# Patient Record
Sex: Female | Born: 1980 | Race: White | Hispanic: No | Marital: Single | Smoking: Current every day smoker
Health system: Southern US, Community
[De-identification: ages and names within clinical notes are randomized; demographics above are authoritative.]

## PROBLEM LIST (undated history)

## (undated) DIAGNOSIS — R011 Cardiac murmur, unspecified: Secondary | ICD-10-CM

---

## 2012-12-08 ENCOUNTER — Encounter (HOSPITAL_COMMUNITY): Payer: Self-pay

## 2012-12-08 ENCOUNTER — Emergency Department (HOSPITAL_COMMUNITY): Payer: Self-pay

## 2012-12-08 ENCOUNTER — Emergency Department (HOSPITAL_COMMUNITY)
Admission: EM | Admit: 2012-12-08 | Discharge: 2012-12-08 | Disposition: A | Discharge status: Home / Self Care | Payer: Self-pay | Attending: Emergency Medicine | Admitting: Emergency Medicine

## 2012-12-08 DIAGNOSIS — K219 Gastro-esophageal reflux disease without esophagitis: Secondary | ICD-10-CM | POA: Insufficient documentation

## 2012-12-08 DIAGNOSIS — R110 Nausea: Secondary | ICD-10-CM | POA: Insufficient documentation

## 2012-12-08 DIAGNOSIS — Z3202 Encounter for pregnancy test, result negative: Secondary | ICD-10-CM | POA: Insufficient documentation

## 2012-12-08 DIAGNOSIS — F17200 Nicotine dependence, unspecified, uncomplicated: Secondary | ICD-10-CM | POA: Insufficient documentation

## 2012-12-08 LAB — CBC WITH DIFFERENTIAL/PLATELET
Basophils Absolute: 0 10*3/uL (ref 0.0–0.1)
Eosinophils Relative: 1 % (ref 0–5)
Lymphocytes Relative: 29 % (ref 12–46)
Lymphs Abs: 2.3 10*3/uL (ref 0.7–4.0)
Neutro Abs: 4.9 10*3/uL (ref 1.7–7.7)
Neutrophils Relative %: 62 % (ref 43–77)
Platelets: 236 10*3/uL (ref 150–400)
RBC: 4.93 MIL/uL (ref 3.87–5.11)
RDW: 12 % (ref 11.5–15.5)
WBC: 7.9 10*3/uL (ref 4.0–10.5)

## 2012-12-08 LAB — PREGNANCY, URINE: Preg Test, Ur: NEGATIVE

## 2012-12-08 LAB — URINALYSIS, ROUTINE W REFLEX MICROSCOPIC
Leukocytes, UA: NEGATIVE
Nitrite: NEGATIVE
Specific Gravity, Urine: <1.005 — ABNORMAL LOW (ref 1.005–1.030)
Urobilinogen, UA: 0.2 mg/dL (ref 0.0–1.0)
pH: 7 (ref 5.0–8.0)

## 2012-12-08 LAB — COMPREHENSIVE METABOLIC PANEL
ALT: 12 U/L (ref 0–35)
AST: 21 U/L (ref 0–37)
Alkaline Phosphatase: 57 U/L (ref 39–117)
CO2: 30 mEq/L (ref 19–32)
Calcium: 9.6 mg/dL (ref 8.4–10.5)
GFR calc non Af Amer: >90 mL/min (ref >90)
Potassium: 3.8 mEq/L (ref 3.5–5.1)
Sodium: 140 mEq/L (ref 135–145)

## 2012-12-08 MED ORDER — RANITIDINE HCL 150 MG PO TABS: 150.0000 mg | ORAL_TABLET | Freq: Two times a day (BID) | ORAL | Status: DC

## 2012-12-08 MED ORDER — FAMOTIDINE 20 MG PO TABS
20.0000 mg | ORAL_TABLET | Freq: Once | ORAL | Status: AC
  Administered 2012-12-08: 20 mg via ORAL
  Filled 2012-12-08: qty 1

## 2012-12-08 MED ORDER — ONDANSETRON 8 MG PO TBDP
8.0000 mg | ORAL_TABLET | Freq: Once | ORAL | Status: AC
  Administered 2012-12-08: 8 mg via ORAL
  Filled 2012-12-08: qty 1

## 2012-12-08 MED ORDER — PROMETHAZINE HCL 12.5 MG PO TABS: 12.5000 mg | ORAL_TABLET | Freq: Four times a day (QID) | ORAL | Status: DC | PRN

## 2012-12-08 NOTE — ED Notes (Signed)
Was informed by Tech that patient smoking electronic cigarette. Patient advised of hospital policy on tobacco products.

## 2012-12-08 NOTE — ED Provider Notes (Signed)
History     CSN: [id number]  Arrival date & time 12/08/12  1001   First MD Initiated Contact with Patient 12/08/12 1044      Chief Complaint  Patient presents with  . Abdominal Pain    (Consider location/radiation/quality/duration/timing/severity/associated sxs/prior treatment) Patient is a 32 y.o. female presenting with abdominal pain. The history is provided by the patient.  Abdominal Pain Pain location:  Epigastric Pain quality: burning   Pain radiates to:  RUQ Pain severity:  Moderate Onset quality:  Gradual Duration:  2 days Timing:  Intermittent Chronicity:  New Relieved by:  Nothing Worsened by:  Eating Ineffective treatments:  Antacids and belching Associated symptoms: nausea   Associated symptoms: no chest pain, no chills, no fever, no shortness of breath, no sore throat and no vomiting     History reviewed. No pertinent past medical history.  History reviewed. No pertinent past surgical history.  No family history on file.  History  Substance Use Topics  . Smoking status: Current Every Day Smoker  . Smokeless tobacco: Not on file  . Alcohol Use: No    OB History   Grav Para Term Preterm Abortions TAB SAB Ect Mult Living                  Review of Systems  Constitutional: Negative for fever, chills, activity change and appetite change.  HENT: Negative for congestion, sore throat and neck pain.   Eyes: Negative for redness and itching.  Respiratory: Negative for shortness of breath.   Cardiovascular: Negative for chest pain.  Gastrointestinal: Positive for nausea and abdominal pain. Negative for vomiting.  Genitourinary: Negative for pelvic pain.  Musculoskeletal: Back pain: pain radiates to the back.  Skin: Negative for rash.  Neurological: Negative for dizziness, syncope and headaches.  Psychiatric/Behavioral: The patient is not nervous/anxious.     Allergies  Review of patient's allergies indicates no known allergies.  Home Medications    Current Outpatient Rx  Name  Route  Sig  Dispense  Refill  . tetrahydrozoline (VISINE) 0.05 % ophthalmic solution   Both Eyes   Place 1 drop into both eyes daily as needed (dry eyes).           BP 105/69  Pulse 90  Temp(Src) 98.9 F (37.2 C) (Oral)  Resp 20  Ht 5\' 2"  (1.575 m)  Wt 130 lb (58.968 kg)  BMI 23.77 kg/m2  SpO2 100%  LMP 11/24/2012  Physical Exam  Nursing note and vitals reviewed. Constitutional: She is oriented to person, place, and time. She appears well-developed and well-nourished. No distress.  Eyes: EOM are normal.  Neck: Neck supple.  Cardiovascular: Normal rate, regular rhythm and normal heart sounds.   Pulmonary/Chest: Effort normal and breath sounds normal.  Abdominal: Soft. Bowel sounds are normal. She exhibits no distension and no mass. There is tenderness in the epigastric area. There is no CVA tenderness.  Musculoskeletal: Normal range of motion.  Neurological: She is alert and oriented to person, place, and time. No cranial nerve deficit.  Skin: Skin is warm and dry.  Psychiatric: She has a normal mood and affect. Her behavior is normal. Judgment and thought content normal.   Results for orders placed during the hospital encounter of 12/08/12 (from the past 24 hour(s))  URINALYSIS, ROUTINE W REFLEX MICROSCOPIC     Status: Abnormal   Collection Time    12/08/12 10:23 AM      Result Value Range   Color, Urine YELLOW  YELLOW  APPearance CLEAR  CLEAR   Specific Gravity, Urine <1.005 (*) 1.005 - 1.030   pH 7.0  5.0 - 8.0   Glucose, UA NEGATIVE  NEGATIVE mg/dL   Hgb urine dipstick NEGATIVE  NEGATIVE   Bilirubin Urine NEGATIVE  NEGATIVE   Ketones, ur NEGATIVE  NEGATIVE mg/dL   Protein, ur NEGATIVE  NEGATIVE mg/dL   Urobilinogen, UA 0.2  0.0 - 1.0 mg/dL   Nitrite NEGATIVE  NEGATIVE   Leukocytes, UA NEGATIVE  NEGATIVE  PREGNANCY, URINE     Status: None   Collection Time    12/08/12 10:23 AM      Result Value Range   Preg Test, Ur NEGATIVE   NEGATIVE  CBC WITH DIFFERENTIAL     Status: Abnormal   Collection Time    12/08/12 10:59 AM      Result Value Range   WBC 7.9  4.0 - 10.5 K/uL   RBC 4.93  3.87 - 5.11 MIL/uL   Hemoglobin 15.4 (*) 12.0 - 15.0 g/dL   HCT 14.7  82.9 - 56.2 %   MCV 89.7  78.0 - 100.0 fL   MCH 31.2  26.0 - 34.0 pg   MCHC 34.8  30.0 - 36.0 g/dL   RDW 13.0  86.5 - 78.4 %   Platelets 236  150 - 400 K/uL   Neutrophils Relative 62  43 - 77 %   Neutro Abs 4.9  1.7 - 7.7 K/uL   Lymphocytes Relative 29  12 - 46 %   Lymphs Abs 2.3  0.7 - 4.0 K/uL   Monocytes Relative 8  3 - 12 %   Monocytes Absolute 0.6  0.1 - 1.0 K/uL   Eosinophils Relative 1  0 - 5 %   Eosinophils Absolute 0.1  0.0 - 0.7 K/uL   Basophils Relative 0  0 - 1 %   Basophils Absolute 0.0  0.0 - 0.1 K/uL  COMPREHENSIVE METABOLIC PANEL     Status: None   Collection Time    12/08/12 10:59 AM      Result Value Range   Sodium 140  135 - 145 mEq/L   Potassium 3.8  3.5 - 5.1 mEq/L   Chloride 101  96 - 112 mEq/L   CO2 30  19 - 32 mEq/L   Glucose, Bld 93  70 - 99 mg/dL   BUN 9  6 - 23 mg/dL   Creatinine, Ser 6.96  0.50 - 1.10 mg/dL   Calcium 9.6  8.4 - 29.5 mg/dL   Total Protein 7.3  6.0 - 8.3 g/dL   Albumin 4.4  3.5 - 5.2 g/dL   AST 21  0 - 37 U/L   ALT 12  0 - 35 U/L   Alkaline Phosphatase 57  39 - 117 U/L   Total Bilirubin 0.5  0.3 - 1.2 mg/dL   GFR calc non Af Amer >90  >90 mL/min   GFR calc Af Amer >90  >90 mL/min  LIPASE, BLOOD     Status: None   Collection Time    12/08/12 10:59 AM      Result Value Range   Lipase 26  11 - 59 U/L    US Abdomen Limited Ruq  12/08/2012  *RADIOLOGY REPORT*  Clinical Data:  Right upper quadrant and epigastric abdominal pain with associated nausea.  LIMITED ABDOMINAL ULTRASOUND - RIGHT UPPER QUADRANT  Comparison:  None.  Findings:  Gallbladder:  No shadowing gallstones or echogenic sludge.  No gallbladder wall thickening or  pericholecystic fluid.  Negative sonographic Murphy's sign according to the  ultrasound technologist.  Common bile duct:  Normal caliber of 4 mm.  Liver:  No focal mass lesion seen.  Within normal limits in parenchymal echogenicity.  IMPRESSION: Normal right upper quadrant ultrasound.                    Original Report Authenticated By: Irish Lack, M.D.     Assessment: 32 y.o. female with epigastric pain and nausea  Plan:  Treat symptoms   Follow up with GI    ED Course  Procedures (including critical care time)   MDM  I have reviewed this patient's vital signs, nurses notes, appropriate labs and imaging.  I have discussed findings and plan of care with the patient and need for follow up. Patient voices understanding.   Medication List    TAKE these medications       promethazine 12.5 MG tablet  Commonly known as:  PHENERGAN  Take 1 tablet (12.5 mg total) by mouth every 6 (six) hours as needed for nausea.     ranitidine 150 MG tablet  Commonly known as:  ZANTAC  Take 1 tablet (150 mg total) by mouth 2 (two) times daily.      ASK your doctor about these medications       VISINE 0.05 % ophthalmic solution  Generic drug:  tetrahydrozoline  Place 1 drop into both eyes daily as needed (dry eyes).               [place], [place] 12/08/12 [phone]

## 2012-12-08 NOTE — ED Notes (Signed)
Went into pt's room, pt was smoking a electronic cigarette, told pt that she can not smoke in the hospital.  RN is aware.

## 2012-12-08 NOTE — ED Notes (Signed)
Pt c/o upper abd pain x 2 days with nausea and "sour burps"  Denies vomiting or diarrhea.  LBM was yesterday

## 2012-12-13 NOTE — ED Provider Notes (Signed)
Medical screening examination/treatment/procedure(s) were performed by non-physician practitioner and as supervising physician I was immediately available for consultation/collaboration.   Shelda Jakes, MD 12/13/12 [phone]

## 2013-05-10 ENCOUNTER — Encounter (HOSPITAL_COMMUNITY): Payer: Self-pay | Admitting: *Deleted

## 2013-05-10 ENCOUNTER — Emergency Department (HOSPITAL_COMMUNITY)
Admission: EM | Admit: 2013-05-10 | Discharge: 2013-05-10 | Disposition: A | Discharge status: Home / Self Care | Payer: Self-pay | Attending: Emergency Medicine | Admitting: Emergency Medicine

## 2013-05-10 ENCOUNTER — Emergency Department (HOSPITAL_COMMUNITY): Payer: Self-pay

## 2013-05-10 DIAGNOSIS — R1013 Epigastric pain: Secondary | ICD-10-CM | POA: Insufficient documentation

## 2013-05-10 DIAGNOSIS — R110 Nausea: Secondary | ICD-10-CM | POA: Insufficient documentation

## 2013-05-10 DIAGNOSIS — R109 Unspecified abdominal pain: Secondary | ICD-10-CM

## 2013-05-10 DIAGNOSIS — Z3202 Encounter for pregnancy test, result negative: Secondary | ICD-10-CM | POA: Insufficient documentation

## 2013-05-10 DIAGNOSIS — Z79899 Other long term (current) drug therapy: Secondary | ICD-10-CM | POA: Insufficient documentation

## 2013-05-10 DIAGNOSIS — F17200 Nicotine dependence, unspecified, uncomplicated: Secondary | ICD-10-CM | POA: Insufficient documentation

## 2013-05-10 LAB — COMPREHENSIVE METABOLIC PANEL
BUN: 7 mg/dL (ref 6–23)
CO2: 26 mEq/L (ref 19–32)
Chloride: 104 mEq/L (ref 96–112)
Creatinine, Ser: 0.75 mg/dL (ref 0.50–1.10)
GFR calc non Af Amer: >90 mL/min (ref >90)
Glucose, Bld: 88 mg/dL (ref 70–99)
Total Bilirubin: 0.2 mg/dL — ABNORMAL LOW (ref 0.3–1.2)

## 2013-05-10 LAB — URINALYSIS, ROUTINE W REFLEX MICROSCOPIC
Glucose, UA: NEGATIVE mg/dL
Hgb urine dipstick: NEGATIVE
Ketones, ur: NEGATIVE mg/dL
Leukocytes, UA: NEGATIVE
pH: 7.5 (ref 5.0–8.0)

## 2013-05-10 LAB — CBC WITH DIFFERENTIAL/PLATELET
HCT: 42.7 % (ref 36.0–46.0)
Hemoglobin: 14.6 g/dL (ref 12.0–15.0)
Lymphocytes Relative: 34 % (ref 12–46)
Monocytes Absolute: 0.7 10*3/uL (ref 0.1–1.0)
Monocytes Relative: 8 % (ref 3–12)
Neutro Abs: 5 10*3/uL (ref 1.7–7.7)
WBC: 8.9 10*3/uL (ref 4.0–10.5)

## 2013-05-10 LAB — LIPASE, BLOOD: Lipase: 35 U/L (ref 11–59)

## 2013-05-10 MED ORDER — ONDANSETRON 8 MG PO TBDP
8.0000 mg | ORAL_TABLET | Freq: Once | ORAL | Status: AC
  Administered 2013-05-10: 8 mg via ORAL
  Filled 2013-05-10: qty 1

## 2013-05-10 MED ORDER — FAMOTIDINE 20 MG PO TABS
40.0000 mg | ORAL_TABLET | Freq: Once | ORAL | Status: AC
  Administered 2013-05-10: 40 mg via ORAL
  Filled 2013-05-10: qty 2

## 2013-05-10 MED ORDER — ONDANSETRON HCL 4 MG PO TABS: 4.0000 mg | ORAL_TABLET | Freq: Three times a day (TID) | ORAL | Status: DC | PRN

## 2013-05-10 NOTE — ED Notes (Signed)
Sharp pain w/pressure in epigastrum over last 2 weeks.  Gets a little worse with eating and lying down.  Nausea but denies vomiting or diarrhea.

## 2013-05-10 NOTE — ED Provider Notes (Signed)
CSN: [id number]     Arrival date & time 05/10/13  1535 History   First MD Initiated Contact with Patient 05/10/13 1543     Chief Complaint  Patient presents with  . Abdominal Pain    HPI Pt was seen at 1555.  Per pt, c/o gradual onset and persistence of constant upper abd "pain" for the past 1 year, worse over the past 2 weeks.   Has been associated with nausea. Describes the abd pain as "pressure." States she was previously taking zantac with relief. Pain worsens when she lays down. Denies any relation to food intake.  Denies vomiting/diarrhea, no fevers, no back pain, no rash, no CP/SOB, no black or blood in stools.  Pt has been evaluated previously in the ED for same, but did not f/u with PMD or GI MD.    History reviewed. No pertinent past medical history.  History reviewed. No pertinent past surgical history.  History  Substance Use Topics  . Smoking status: Current Every Day Smoker -- 1.00 packs/day    Types: Cigarettes  . Smokeless tobacco: Not on file  . Alcohol Use: No    Review of Systems ROS: Statement: All systems negative except as marked or noted in the HPI; Constitutional: Negative for fever and chills. ; ; Eyes: Negative for eye pain, redness and discharge. ; ; ENMT: Negative for ear pain, hoarseness, nasal congestion, sinus pressure and sore throat. ; ; Cardiovascular: Negative for chest pain, palpitations, diaphoresis, dyspnea and peripheral edema. ; ; Respiratory: Negative for cough, wheezing and stridor. ; ; Gastrointestinal: +nausea, abd pain. Negative for vomiting, diarrhea, blood in stool, hematemesis, jaundice and rectal bleeding. . ; ; Genitourinary: Negative for dysuria, flank pain and hematuria. ; ; Musculoskeletal: Negative for back pain and neck pain. Negative for swelling and trauma.; ; Skin: Negative for pruritus, rash, abrasions, blisters, bruising and skin lesion.; ; Neuro: Negative for headache, lightheadedness and neck stiffness. Negative for weakness,  altered level of consciousness , altered mental status, extremity weakness, paresthesias, involuntary movement, seizure and syncope.     Allergies  Review of patient's allergies indicates no known allergies.  Home Medications   Current Outpatient Rx  Name  Route  Sig  Dispense  Refill  . promethazine (PHENERGAN) 12.5 MG tablet   Oral   Take 1 tablet (12.5 mg total) by mouth every 6 (six) hours as needed for nausea.   15 tablet   0   . ranitidine (ZANTAC) 150 MG tablet   Oral   Take 1 tablet (150 mg total) by mouth 2 (two) times daily.   60 tablet   0   . tetrahydrozoline (VISINE) 0.05 % ophthalmic solution   Both Eyes   Place 1 drop into both eyes daily as needed (dry eyes).          BP 100/70  Pulse 86  Temp(Src) 98.3 F (36.8 C) (Oral)  Resp 16  Ht 5\' 2"  (1.575 m)  Wt 130 lb (58.968 kg)  BMI 23.77 kg/m2  SpO2 100% Physical Exam 1600: Physical examination:  Nursing notes reviewed; Vital signs and O2 SAT reviewed;  Constitutional: Well developed, Well nourished, Well hydrated, In no acute distress; Head:  Normocephalic, atraumatic; Eyes: EOMI, PERRL, No scleral icterus; ENMT: Mouth and pharynx normal, Mucous membranes moist; Neck: Supple, Full range of motion, No lymphadenopathy; Cardiovascular: Regular rate and rhythm, No murmur, rub, or gallop; Respiratory: Breath sounds clear & equal bilaterally, No rales, rhonchi, wheezes.  Speaking full sentences with ease, Normal  respiratory effort/excursion; Chest: Nontender, Movement normal; Abdomen: Soft, +mid-epigastric area tenderness to palp. No rebound or guarding. Nondistended, Normal bowel sounds; Genitourinary: No CVA tenderness; Extremities: Pulses normal, No tenderness, No edema, No calf edema or asymmetry.; Neuro: AA&Ox3, Major CN grossly intact.  Speech clear. Climbs on and off stretcher easily by herself. Gait steady. No gross focal motor or sensory deficits in extremities.; Skin: Color normal, Warm, Dry.   ED Course   Procedures    MDM  MDM Reviewed: previous chart, nursing note and vitals Reviewed previous: labs and ultrasound Interpretation: labs, x-ray and ultrasound     Results for orders placed during the hospital encounter of 05/10/13  PREGNANCY, URINE      Result Value Range   Preg Test, Ur NEGATIVE  NEGATIVE  URINALYSIS, ROUTINE W REFLEX MICROSCOPIC      Result Value Range   Color, Urine YELLOW  YELLOW   APPearance CLEAR  CLEAR   Specific Gravity, Urine 1.015  1.005 - 1.030   pH 7.5  5.0 - 8.0   Glucose, UA NEGATIVE  NEGATIVE mg/dL   Hgb urine dipstick NEGATIVE  NEGATIVE   Bilirubin Urine NEGATIVE  NEGATIVE   Ketones, ur NEGATIVE  NEGATIVE mg/dL   Protein, ur NEGATIVE  NEGATIVE mg/dL   Urobilinogen, UA 0.2  0.0 - 1.0 mg/dL   Nitrite NEGATIVE  NEGATIVE   Leukocytes, UA NEGATIVE  NEGATIVE  CBC WITH DIFFERENTIAL      Result Value Range   WBC 8.9  4.0 - 10.5 K/uL   RBC 4.66  3.87 - 5.11 MIL/uL   Hemoglobin 14.6  12.0 - 15.0 g/dL   HCT 16.1  09.6 - 04.5 %   MCV 91.6  78.0 - 100.0 fL   MCH 31.3  26.0 - 34.0 pg   MCHC 34.2  30.0 - 36.0 g/dL   RDW 40.9  81.1 - 91.4 %   Platelets 266  150 - 400 K/uL   Neutrophils Relative % 56  43 - 77 %   Neutro Abs 5.0  1.7 - 7.7 K/uL   Lymphocytes Relative 34  12 - 46 %   Lymphs Abs 3.1  0.7 - 4.0 K/uL   Monocytes Relative 8  3 - 12 %   Monocytes Absolute 0.7  0.1 - 1.0 K/uL   Eosinophils Relative 2  0 - 5 %   Eosinophils Absolute 0.1  0.0 - 0.7 K/uL   Basophils Relative 0  0 - 1 %   Basophils Absolute 0.0  0.0 - 0.1 K/uL  COMPREHENSIVE METABOLIC PANEL      Result Value Range   Sodium 141  135 - 145 mEq/L   Potassium 3.6  3.5 - 5.1 mEq/L   Chloride 104  96 - 112 mEq/L   CO2 26  19 - 32 mEq/L   Glucose, Bld 88  70 - 99 mg/dL   BUN 7  6 - 23 mg/dL   Creatinine, Ser 7.82  0.50 - 1.10 mg/dL   Calcium 9.3  8.4 - 95.6 mg/dL   Total Protein 7.3  6.0 - 8.3 g/dL   Albumin 4.1  3.5 - 5.2 g/dL   AST 20  0 - 37 U/L   ALT 13  0 - 35 U/L    Alkaline Phosphatase 63  39 - 117 U/L   Total Bilirubin 0.2 (*) 0.3 - 1.2 mg/dL   GFR calc non Af Amer >90  >90 mL/min   GFR calc Af Amer >90  >90 mL/min  LIPASE, BLOOD      Result Value Range   Lipase 35  11 - 59 U/L   Dg Abd Acute W/chest 05/10/2013   CLINICAL DATA:  Epigastric pain  EXAM: ACUTE ABDOMEN SERIES (ABDOMEN 2 VIEW & CHEST 1 VIEW)  COMPARISON:  None.  FINDINGS: Sigmoid scoliosis of the thoracolumbar spine. Heart size and mediastinal contours normal. Lungs clear. No free air. No abnormally dilated loops of bowel. Intrauterine device noted.  IMPRESSION: No acute findings   Electronically Signed   By: Esperanza Heir M.D.   On: 05/10/2013 16:43   US Abdomen Limited Ruq 05/10/2013   CLINICAL DATA:  Upper abdominal pain; patient had lunch 2 hr prior to exam  EXAM: US ABDOMEN LIMITED - RIGHT UPPER QUADRANT  COMPARISON:  12/08/2012  FINDINGS: Gallbladder  Partially distended, normal appearance. No gallbladder wall thickening, pericholecystic fluid, gallstones or sonographic Murphy's sign.  Common bile duct  Diameter: Normal caliber 4 mm diameter  Liver:  Normal appearance  No right upper quadrant free fluid identified.  IMPRESSION: Normal right upper quadrant ultrasound, unchanged.   Electronically Signed   By: Ulyses Southward M.D.   On: 05/10/2013 16:28    1845: Pt has tol PO well while in the ED without N/V.  No stooling while in the ED.  Abd benign, VSS. Feels better and wants to go home now. Dx and testing d/w pt.  Questions answered.  Verb understanding, agreeable to d/c home with outpt f/u.    Laray Anger, DO 05/13/13 1334

## 2014-05-13 ENCOUNTER — Emergency Department (HOSPITAL_COMMUNITY)
Admission: EM | Admit: 2014-05-13 | Discharge: 2014-05-13 | Disposition: A | Discharge status: Home / Self Care | Payer: Medicaid Other | Attending: Emergency Medicine | Admitting: Emergency Medicine

## 2014-05-13 ENCOUNTER — Encounter (HOSPITAL_COMMUNITY): Payer: Self-pay | Admitting: Emergency Medicine

## 2014-05-13 ENCOUNTER — Emergency Department (HOSPITAL_COMMUNITY): Payer: Medicaid Other

## 2014-05-13 DIAGNOSIS — Z720 Tobacco use: Secondary | ICD-10-CM | POA: Diagnosis not present

## 2014-05-13 DIAGNOSIS — R55 Syncope and collapse: Secondary | ICD-10-CM

## 2014-05-13 DIAGNOSIS — I951 Orthostatic hypotension: Secondary | ICD-10-CM | POA: Insufficient documentation

## 2014-05-13 DIAGNOSIS — Z3202 Encounter for pregnancy test, result negative: Secondary | ICD-10-CM | POA: Insufficient documentation

## 2014-05-13 DIAGNOSIS — N390 Urinary tract infection, site not specified: Secondary | ICD-10-CM | POA: Insufficient documentation

## 2014-05-13 LAB — URINALYSIS, ROUTINE W REFLEX MICROSCOPIC
Bilirubin Urine: NEGATIVE
Glucose, UA: NEGATIVE mg/dL
KETONES UR: NEGATIVE mg/dL
Nitrite: NEGATIVE
PROTEIN: NEGATIVE mg/dL
Specific Gravity, Urine: 1.01 (ref 1.005–1.030)
Urobilinogen, UA: 0.2 mg/dL (ref 0.0–1.0)
pH: 7.5 (ref 5.0–8.0)

## 2014-05-13 LAB — URINE MICROSCOPIC-ADD ON

## 2014-05-13 LAB — I-STAT CHEM 8, ED
BUN: 10 mg/dL (ref 6–23)
CHLORIDE: 105 meq/L (ref 96–112)
CREATININE: 0.9 mg/dL (ref 0.50–1.10)
Calcium, Ion: 1.14 mmol/L (ref 1.12–1.23)
GLUCOSE: 79 mg/dL (ref 70–99)
HCT: 45 % (ref 36.0–46.0)
HEMOGLOBIN: 15.3 g/dL — AB (ref 12.0–15.0)
Potassium: 3.8 mEq/L (ref 3.7–5.3)
Sodium: 141 mEq/L (ref 137–147)
TCO2: 28 mmol/L (ref 0–100)

## 2014-05-13 LAB — PREGNANCY, URINE: Preg Test, Ur: NEGATIVE

## 2014-05-13 MED ORDER — SODIUM CHLORIDE 0.9 % IV BOLUS (SEPSIS)
1000.0000 mL | Freq: Once | INTRAVENOUS | Status: AC
  Administered 2014-05-13: 1000 mL via INTRAVENOUS

## 2014-05-13 MED ORDER — SODIUM CHLORIDE 0.9 % IV SOLN: INTRAVENOUS | Status: DC

## 2014-05-13 MED ORDER — CEPHALEXIN 500 MG PO CAPS: 500.0000 mg | ORAL_CAPSULE | Freq: Four times a day (QID) | ORAL | Status: DC

## 2014-05-13 NOTE — ED Notes (Signed)
Pt's IV d/c'd from right AC, catheter intact and site WNL

## 2014-05-13 NOTE — Discharge Instructions (Signed)
°Emergency Department Resource Guide °1) Find a Doctor and Pay Out of Pocket °Although you won't have to find out who is covered by your insurance plan, it is a good idea to ask around and get recommendations. You will then need to call the office and see if the doctor you have chosen will accept you as a new patient and what types of options they offer for patients who are self-pay. Some doctors offer discounts or will set up payment plans for their patients who do not have insurance, but you will need to ask so you aren't surprised when you get to your appointment. ° °2) Contact Your Local Health Department °Not all health departments have doctors that can see patients for sick visits, but many do, so it is worth a call to see if yours does. If you don't know where your local health department is, you can check in your phone book. The CDC also has a tool to help you locate your state's health department, and many state websites also have listings of all of their local health departments. ° °3) Find a Walk-in Clinic °If your illness is not likely to be very severe or complicated, you may want to try a walk in clinic. These are popping up all over the country in pharmacies, drugstores, and shopping centers. They're usually staffed by nurse practitioners or physician assistants that have been trained to treat common illnesses and complaints. They're usually fairly quick and inexpensive. However, if you have serious medical issues or chronic medical problems, these are probably not your best option. ° °No Primary Care Doctor: °- Call [facility] at  832-8000 - they can help you locate a primary care doctor that  accepts your insurance, provides certain services, etc. °- Physician Referral Service- 1-800-533-3463 ° °Chronic Pain Problems: °Organization         Address  Phone   Notes  °[facility]  (336) 297-2271 Patients need to be referred by their primary care doctor.  ° °Medication  Assistance: °Organization         Address  Phone   Notes  °[place] [facility] [address] °[place], [place] [address] (336) 641-8030 --Must be a resident of [place] °-- Must have NO insurance coverage whatsoever (no Medicaid/ Medicare, etc.) °-- The pt. MUST have a primary care doctor that directs their care regularly and follows them in the community °  °[facility]  (866) 331-1348   °[employer]  (888) 892-1162   ° °Agencies that provide inexpensive medical care: °Organization         Address  Phone   Notes  °[facility]  (336) 832-8035   °[facility]    (336) 832-7272   °[facility] [address] °[place], [place] [address] (336) 832-4777   °[facility] [address], °[place] (336) 271-4999   °[facility]    (336) 373-0678   °[facility]    (336) 272-1050   °[facility] ° [address], [place] Phone:  (336) 832-4444, Fax:  (336) 832-4440 Hours of Operation:  9 am - 6 pm, M-F.  Also accepts Medicaid/Medicare and self-pay.  °[facility] ° [address], [place] Phone: (336) 832-3150, Fax: (336) [phone]. Hours of Operation:  8:30 am - 5:30 pm, M-F.  Also accepts Medicaid and self-pay.  °[facility] [place] [address], [place] Phone: (336) 878-6027   °[facility] [address], [place], [place] (336)723-1848, Ext. 123 Mondays & Thursdays: 7-9 AM.  First 15 patients are seen on a first come, first serve basis. °  ° °Medicaid-accepting [place] Providers: ° °Organization         Address  Phone   Notes  °[facility] [address], [place] (336) 641-2100 Also accepts self-pay patients.  °[facility] [address], [place] ° (336) 856-9996   °[facility] [address], [place]  (336) 288-8857   °[facility] [address] [place] [address], [place] (336) 299-7000   °Veita Bland [address], [place]  ° (336) 373-1557 Only accepts [place] Access Medicaid patients after they have their name applied to their card.  ° °Self-Pay (no insurance) in [place]: ° °Organization         Address  Phone   Notes  °Sickle Cell Patients, [facility] [address], [place] (336) 832-1970   °[facility] [address], [place] (336) 832-4400   °[facility] ° [address], [place] (336) 992-4800   °[facility]/Dr. Osei-Bonsu ° [address] [place] [address], [place] or [address], [place] (336) 841-8500 Phone number for both [place] and [place] locations is the same.  °[facility] [address], [place] (336) 299-0000   °[facility] [address] [place] [address], [place] or [address] (336) 852-7530 °(336) 878-2260   °[facility] [address], [place] (336) 350-1642, phone; (336) 294-5005, fax Sees patients 1st and 3rd Saturday of every month.  Must not qualify for public or private insurance (i.e. Medicaid, Medicare, [place] Health Choice, Veterans' Benefits) • Household income should be no more than 200% of the poverty level •The clinic cannot treat you if you are pregnant or think you are pregnant • Sexually transmitted diseases are not treated at the clinic.  ° ° °Dental Care: °Organization         Address  Phone  Notes  °[place] [facility] [address], [place] (336) 641-6152 Accepts children up to age 21 who are enrolled in Medicaid or [place] Health Choice; pregnant women with a Medicaid card; and children who have applied for Medicaid or [place] Health Choice, but were declined, whose parents can pay a reduced fee at time of service.  °[place]  [facility] [place]  [address], [place] (336) 641-7733 Accepts children up to age 21 who are enrolled in Medicaid or [place] Health Choice; pregnant women with a Medicaid card; and children who have applied for Medicaid or [place] Health Choice, but were declined, whose parents can pay a reduced fee at time of service.  °[facility] ° [address], [place] (336) 641-4533 Patients are seen by appointment only. Walk-ins are not accepted. [facility] will see patients 18 years of age and older. °Monday - Tuesday (8am-5pm) °Most Wednesdays (8:30-5pm) °$30 per visit, cash only  °[facility] ° [address], [place] (336) 641-4533 Patients are seen by appointment only. Walk-ins are not accepted. [facility] will see patients 18 years of age and older. °One   Wednesday Evening (Monthly: Volunteer Based).  $30 per visit, cash only  °[facility]  (919) 537-3737 for adults; Children under age 4, call Graduate Pediatric Dentistry at ([phone]) [phone]. Children aged 4-14, please call ([phone]) [phone] to request a pediatric application. ° Dental services are provided in all areas of dental care including fillings, crowns and bridges, complete and partial dentures, implants, gum treatment, root canals, and extractions. Preventive care is also provided. Treatment is provided to both adults and children. °Patients are selected via a lottery and there is often a waiting list. °  °[facility] [address], °[place] ° (336) 763-8833 [email or web address] °  °[facility] [address], [place], [place] (336)723-1848, Ext. 123 Second and Fourth Thursday of each month, opens at 6:30 AM; Clinic ends at 9 AM.  Patients are seen on a first-come first-served basis, and a limited number are seen during each clinic.  ° °[facility] ° [address], [place], [place] (336) 723-7904    Eligibility Requirements °You must have lived in [place], [place], or [place] counties for at least the last three months. °  You cannot be eligible for state or federal sponsored healthcare insurance, including Veterans Administration, Medicaid, or Medicare. °  You generally cannot be eligible for healthcare insurance through your employer.  °  How to apply: °Eligibility screenings are held every Tuesday and Wednesday afternoon from 1:00 pm until 4:00 pm. You do not need an appointment for the interview!  °[facility] [address], [place], [place] 336-631-2330   °[facility]  336-342-8273   °[facility]  336-703-3100   °[facility]  336-570-6415   ° °Behavioral Health Resources in the Community: °Intensive Outpatient Programs °Organization         Address  Phone  Notes  °[facility] [address], [place], [place] 336-878-6098   °[facility] [address], [place], [place] 336-832-9800   °[facility] [address], [place], [place] ° 336-882-2125   °[facility] [address],  °[place], [place] 1-800-853-5163 or 336-641-4981   °Substance Abuse Resources °Organization         Address  Phone  Notes  °[facility]  336-882-2125   °[facility]  336-784-9470   °[facility]  336-285-9073   °[facility]  336-845-3988   °Residential & Outpatient Substance Abuse Program  1-800-659-3381   °Psychological Services °Organization         Address  Phone  Notes  °[facility]  336- 832-9600   °[facility]  336- 378-7881   °[facility] [address], [place] 1-800-853-5163 or 336-641-4981   ° °Mobile Crisis Teams °Organization         Address  Phone  Notes  °[facility], Mobile Crisis Care Unit  1-877-626-1772   °Assertive °[facility] ° [address]  [place], [place] 336-834-9664   °Sharon DeEsch [address] °[place] 336-554-5454   ° °Self-Help/Support Groups °Organization         Address  Phone             Notes  °[facility] of  - variety of support groups  336- 373-1402 Call for more information  °Narcotics Anonymous (NA), [facility] [address], °[place]  2 meetings at this location  ° °  Residential Treatment Programs Organization         Address  Phone  Notes  [facility] [address],    [place]  [phone]   [facility]  [address], [place] [phone], [place], [place] [phone]) [phone]   [facility] [address], [place] [phone] Admissions: 8am-3pm M-F  [facility] [address]    [place], [place] [phone]   [facility] [address], [place], [place] [phone]) [phone]   [facility] [address]  [place], [place] [phone]   [facility] Intensive Outpatient [address], [place], [place] [phone]   [facility] [address]  [place], [place] [phone] or (939) [phone]   [facility] [address] [place], [place] [phone] Accepts Medicaid  [facility] [address]  [place] [phone] Substance Abuse/Addiction Treatment   [facility] Organization         Address  Phone  Notes  [facility]  819-071-2813(888) [phone]   Angie FavaJulie Brannon, PhD [address] [place], [place]   ([phone]) [phone](336) [phone] or (724) [phone](336) [phone]   [facility]   [address] [place], [place] [phone](336) [phone]   [facility] [address], [place], [place] [phone](336) [phone] Insurance/Medicaid/sponsorship through [facility] [address]                                    [place], [place] [phone](336) [phone] Therapy/tele-psych/case    [facility] [address]   [place], [place] [phone]) [phone]    Dr. Lolly MustacheArfeen  [phone](336) [phone]   [facility] [place]  [employer] [facility] 1) [address], [place] 2) [address], [place] 3)  [address], [place] ([phone](336) [phone](336) [phone]  [phone](336) [phone]   [facility] 320-823-1589(336) 313-716-0233 or [phone](336) 5180741759 (After Hours)       Take the prescription as directed. Increase your fluids (ie: Gatorade) for the next several days. Call your regular medical doctor on Monday to schedule a follow up appointment within the next 2 days.  Return to the Emergency Department immediately sooner if worsening.

## 2014-05-13 NOTE — ED Notes (Signed)
Pt had 2 syncopal episodes last night, pt reports hitting head when falling on floor.  Pt reports feeling dizzy today and feels disoriented.

## 2014-05-13 NOTE — ED Provider Notes (Signed)
CSN: [id number]     Arrival date & time 05/13/14  1437 History   First MD Initiated Contact with Patient 05/13/14 1519     Chief Complaint  Patient presents with  . syncopal episode      HPI Pt was seen at 1525. Per pt, c/o sudden onset and resolution of 2 episodes of brief syncope that occurred last night. Pt states she stood up from laying down and "then just passed out." Family states they "saw her just fall to the floor." Pt states this occurred twice. Pt states she feels lightheaded today. Denies prodromal symptoms before either episode. Denies CP/palpitations, no SOB/cough, no abd pain, no N/V/D, no neck or back pain, no visual changes, no focal motor weakness, no tingling/numbness in extremities, no ataxia, no slurred speech, no facial droop, no seizure activity, no incont of bowel/bladder.     History reviewed. No pertinent past medical history.  History reviewed. No pertinent past surgical history.  History  Substance Use Topics  . Smoking status: Current Every Day Smoker -- 1.00 packs/day    Types: Cigarettes  . Smokeless tobacco: Not on file  . Alcohol Use: No     Comment: occ    Review of Systems ROS: Statement: All systems negative except as marked or noted in the HPI; Constitutional: Negative for fever and chills. ; ; Eyes: Negative for eye pain, redness and discharge. ; ; ENMT: Negative for ear pain, hoarseness, nasal congestion, sinus pressure and sore throat. ; ; Cardiovascular: Negative for chest pain, palpitations, diaphoresis, dyspnea and peripheral edema. ; ; Respiratory: Negative for cough, wheezing and stridor. ; ; Gastrointestinal: Negative for nausea, vomiting, diarrhea, abdominal pain, blood in stool, hematemesis, jaundice and rectal bleeding. . ; ; Genitourinary: Negative for dysuria, flank pain and hematuria. ; ; Musculoskeletal: +head injury. Negative for back pain and neck pain. Negative for swelling and deformity..; ; Skin: Negative for pruritus, rash,  abrasions, blisters, bruising and skin lesion.; ; Neuro: Negative for headache and neck stiffness. Negative for weakness, extremity weakness, paresthesias, involuntary movement, seizure and +lightheadedness, +syncope.       Allergies  Review of patient's allergies indicates no known allergies.  Home Medications   Prior to Admission medications   Medication Sig Start Date End Date Taking? Authorizing Provider  famotidine (ACID REDUCER) 10 MG tablet Take 10 mg by mouth daily as needed for heartburn or indigestion.   Yes Historical Provider, MD  meclizine (ANTIVERT) 12.5 MG tablet Take 12.5 mg by mouth daily as needed for dizziness.   Yes Historical Provider, MD  tetrahydrozoline (VISINE) 0.05 % ophthalmic solution Place 1 drop into both eyes daily as needed (dry eyes).   Yes Historical Provider, MD   BP 110/65  Pulse 87  Temp(Src) 98.3 F (36.8 C) (Oral)  Resp 18  SpO2 100% Physical Exam 1530: Physical examination:  Nursing notes reviewed; Vital signs and O2 SAT reviewed;  Constitutional: Well developed, Well nourished, Well hydrated, In no acute distress; Head:  Normocephalic, atraumatic; Eyes: EOMI, PERRL, No scleral icterus; ENMT: TM's clear bilat. +edemetous nasal turbinates bilat with clear rhinorrhea. Mouth and pharynx normal, Mucous membranes moist; Neck: Supple, Full range of motion, No lymphadenopathy; Cardiovascular: Regular rate and rhythm, No murmur, rub, or gallop; Respiratory: Breath sounds clear & equal bilaterally, No rales, rhonchi, wheezes.  Speaking full sentences with ease, Normal respiratory effort/excursion; Chest: Nontender, Movement normal; Abdomen: Soft, Nontender, Nondistended, Normal bowel sounds; Genitourinary: No CVA tenderness; Spine:  No midline CS, TS, LS tenderness.;; Extremities:  Pulses normal, No tenderness, No edema, No calf edema or asymmetry.; Neuro: AA&Ox3, Major CN grossly intact. No facial droop. Speech clear. No gross focal motor or sensory deficits in  extremities. Climbs on and off stretcher easily by herself. Gait steady.; Skin: Color normal, Warm, Dry.   ED Course  Procedures     EKG Interpretation   Date/Time:  Saturday May 13 2014 15:30:58 EDT Ventricular Rate:  73 PR Interval:  126 QRS Duration: 80 QT Interval:  360 QTC Calculation: 397 R Axis:   81 Text Interpretation:  Sinus rhythm No old tracing to compare Confirmed by  [facility]  MD, Nicholos JohnsKATHLEEN [phone]([id number]) on 05/13/2014 4:15:43 PM      MDM  MDM Reviewed: previous chart, nursing note and vitals Interpretation: labs, ECG, CT scan and x-ray   Results for orders placed during the hospital encounter of 05/13/14  URINALYSIS, ROUTINE W REFLEX MICROSCOPIC      Result Value Ref Range   Color, Urine YELLOW  YELLOW   APPearance CLEAR  CLEAR   Specific Gravity, Urine 1.010  1.005 - 1.030   pH 7.5  5.0 - 8.0   Glucose, UA NEGATIVE  NEGATIVE mg/dL   Hgb urine dipstick MODERATE (*) NEGATIVE   Bilirubin Urine NEGATIVE  NEGATIVE   Ketones, ur NEGATIVE  NEGATIVE mg/dL   Protein, ur NEGATIVE  NEGATIVE mg/dL   Urobilinogen, UA 0.2  0.0 - 1.0 mg/dL   Nitrite NEGATIVE  NEGATIVE   Leukocytes, UA SMALL (*) NEGATIVE  PREGNANCY, URINE      Result Value Ref Range   Preg Test, Ur NEGATIVE  NEGATIVE  URINE MICROSCOPIC-ADD ON      Result Value Ref Range   WBC, UA 21-50  <3 WBC/hpf   RBC / HPF 3-6  <3 RBC/hpf   Bacteria, UA FEW (*) RARE  I-STAT CHEM 8, ED      Result Value Ref Range   Sodium 141  137 - 147 mEq/L   Potassium 3.8  3.7 - 5.3 mEq/L   Chloride 105  96 - 112 mEq/L   BUN 10  6 - 23 mg/dL   Creatinine, Ser 1.910.90  0.50 - 1.10 mg/dL   Glucose, Bld 79  70 - 99 mg/dL   Calcium, Ion 4.781.14  2.951.12 - 1.23 mmol/L   TCO2 28  0 - 100 mmol/L   Hemoglobin 15.3 (*) 12.0 - 15.0 g/dL   HCT 62.145.0  30.836.0 - 65.746.0 %   Dg Chest 2 View 05/13/2014   CLINICAL DATA:  Syncope last night.  EXAM: CHEST  2 VIEW  COMPARISON:  May 10, 2013  FINDINGS: The heart size and mediastinal contours are  within normal limits. There is no focal infiltrate, pulmonary edema, or pleural effusion. Vessel on end is identified in the lateral right lung base. The visualized skeletal structures are unremarkable.  IMPRESSION: No active cardiopulmonary disease.   Electronically Signed   By: Sherian ReinWei-Chen  Lin M.D.   On: 05/13/2014 16:43   Ct Head Wo Contrast 05/13/2014   CLINICAL DATA:  Two syncopal episode last night. Patient hit back of head on floor. Dizziness. Initial evaluation.  EXAM: CT HEAD WITHOUT CONTRAST  TECHNIQUE: Contiguous axial images were obtained from the base of the skull through the vertex without intravenous contrast.  COMPARISON:  No prior.  FINDINGS: No intra-axial or extra-axial pathologic shoulder blood collection identified. No mass. No mass. No hydrocephalus visualized orbits are unremarkable. Visualized paranasal sinuses and mastoids are clear. No acute bony abnormality.  IMPRESSION:  No acute intracranial abnormality.   Electronically Signed   By: Maisie Fus  Register   On: 05/13/2014 16:53    1715:  Pt initially orthostatic on VS. IVF bolus given with improvement. +UTI, will tx with keflex. Workup otherwise reassuring. Pt wants to go home now. Dx and testing d/w pt and family.  Questions answered.  Verb understanding, agreeable to d/c home with outpt f/u.    Samuel Jester, DO 05/15/14 1751

## 2014-12-23 ENCOUNTER — Emergency Department (HOSPITAL_COMMUNITY)
Admission: EM | Admit: 2014-12-23 | Discharge: 2014-12-23 | Disposition: A | Discharge status: Home / Self Care | Payer: Worker's Compensation | Attending: Emergency Medicine | Admitting: Emergency Medicine

## 2014-12-23 ENCOUNTER — Encounter (HOSPITAL_COMMUNITY): Payer: Self-pay

## 2014-12-23 DIAGNOSIS — Z720 Tobacco use: Secondary | ICD-10-CM | POA: Insufficient documentation

## 2014-12-23 DIAGNOSIS — Z23 Encounter for immunization: Secondary | ICD-10-CM | POA: Diagnosis not present

## 2014-12-23 DIAGNOSIS — X19XXXA Contact with other heat and hot substances, initial encounter: Secondary | ICD-10-CM | POA: Insufficient documentation

## 2014-12-23 DIAGNOSIS — Z79899 Other long term (current) drug therapy: Secondary | ICD-10-CM | POA: Diagnosis not present

## 2014-12-23 DIAGNOSIS — S0592XA Unspecified injury of left eye and orbit, initial encounter: Secondary | ICD-10-CM | POA: Diagnosis present

## 2014-12-23 DIAGNOSIS — Y9289 Other specified places as the place of occurrence of the external cause: Secondary | ICD-10-CM | POA: Insufficient documentation

## 2014-12-23 DIAGNOSIS — Y990 Civilian activity done for income or pay: Secondary | ICD-10-CM | POA: Insufficient documentation

## 2014-12-23 DIAGNOSIS — Y9389 Activity, other specified: Secondary | ICD-10-CM | POA: Insufficient documentation

## 2014-12-23 DIAGNOSIS — H5712 Ocular pain, left eye: Secondary | ICD-10-CM

## 2014-12-23 MED ORDER — ERYTHROMYCIN 5 MG/GM OP OINT
TOPICAL_OINTMENT | Freq: Once | OPHTHALMIC | Status: AC
  Administered 2014-12-23: 1 via OPHTHALMIC
  Filled 2014-12-23: qty 3.5

## 2014-12-23 MED ORDER — TETRACAINE HCL 0.5 % OP SOLN
1.0000 [drp] | Freq: Once | OPHTHALMIC | Status: AC
  Administered 2014-12-23: 1 [drp] via OPHTHALMIC
  Filled 2014-12-23: qty 2

## 2014-12-23 MED ORDER — TETANUS-DIPHTH-ACELL PERTUSSIS 5-2.5-18.5 LF-MCG/0.5 IM SUSP
0.5000 mL | Freq: Once | INTRAMUSCULAR | Status: AC
  Administered 2014-12-23: 0.5 mL via INTRAMUSCULAR
  Filled 2014-12-23: qty 0.5

## 2014-12-23 MED ORDER — HYDROCODONE-ACETAMINOPHEN 5-325 MG PO TABS: 1.0000 | ORAL_TABLET | ORAL | Status: DC | PRN

## 2014-12-23 MED ORDER — HYPROMELLOSE (GONIOSCOPIC) 2.5 % OP SOLN: 1.0000 [drp] | OPHTHALMIC | Status: DC | PRN

## 2014-12-23 MED ORDER — FLUORESCEIN SODIUM 1 MG OP STRP
1.0000 | ORAL_STRIP | Freq: Once | OPHTHALMIC | Status: AC
  Administered 2014-12-23: 1 via OPHTHALMIC
  Filled 2014-12-23: qty 1

## 2014-12-23 MED ORDER — POLYVINYL ALCOHOL 1.4 % OP SOLN
1.0000 [drp] | OPHTHALMIC | Status: DC | PRN
  Administered 2014-12-23: 1 [drp] via OPHTHALMIC
  Filled 2014-12-23: qty 15

## 2014-12-23 NOTE — ED Provider Notes (Signed)
CSN: [id number]     Arrival date & time 12/23/14  2130 History   First MD Initiated Contact with Patient 12/23/14 0957     Chief Complaint  Patient presents with  . Eye Injury     (Consider location/radiation/quality/duration/timing/severity/associated sxs/prior Treatment) Patient is a 34 y.o. female presenting with eye injury. The history is provided by the patient.  Eye Injury This is a new problem. The current episode started today. The problem has been unchanged. Associated symptoms include a visual change. Treatments tried: cold water rinses. The treatment provided mild relief.   Hannah Molina is a 34 y.o. female who presents to the ED with left eye pain s/p injury at work. Patient states that at approximately 9 am grease splashed in her eye at work. She had immediate pain and decreased vision in the left eye. She went immediately and started flushing with cool water. She continues to have pain and decreased vision.   History reviewed. No pertinent past medical history. History reviewed. No pertinent past surgical history. No family history on file. History  Substance Use Topics  . Smoking status: Current Every Day Smoker -- 1.00 packs/day    Types: Cigarettes  . Smokeless tobacco: Not on file  . Alcohol Use: No     Comment: occ   OB History    No data available     Review of Systems Negative except as stated in HPI   Allergies  Review of patient's allergies indicates no known allergies.  Home Medications   Prior to Admission medications   Medication Sig Start Date End Date Taking? Authorizing Provider  BIOTIN PO Take 1 tablet by mouth daily.   Yes Historical Provider, MD  famotidine (ACID REDUCER) 10 MG tablet Take 10 mg by mouth daily as needed for heartburn or indigestion.   Yes Historical Provider, MD  meclizine (ANTIVERT) 12.5 MG tablet Take 12.5 mg by mouth daily as needed for dizziness.   Yes Historical Provider, MD  tetrahydrozoline (VISINE) 0.05 % ophthalmic  solution Place 1 drop into both eyes daily as needed (dry eyes).   Yes Historical Provider, MD  vitamin B-12 (CYANOCOBALAMIN) 1000 MCG tablet Take 1,000 mcg by mouth daily.   Yes Historical Provider, MD  cephALEXin (KEFLEX) 500 MG capsule Take 1 capsule (500 mg total) by mouth 4 (four) times daily. Patient not taking: Reported on 12/23/2014 05/13/14   Samuel Jester, DO  HYDROcodone-acetaminophen (NORCO/VICODIN) 5-325 MG per tablet Take 1 tablet by mouth every 4 (four) hours as needed. 12/23/14   Shinita Mac Orlene Och, NP   BP 107/85 mmHg  Pulse 84  Temp(Src) 98.2 F (36.8 C) (Oral)  Resp 16  Ht  (1.575 m)  Wt 130 lb (58.968 kg)  BMI 23.77 kg/m2  SpO2 99% Physical Exam  Constitutional: She is oriented to person, place, and time. She appears well-developed and well-nourished. No distress.  HENT:  Head: Normocephalic.  Eyes: EOM and lids are normal. Pupils are equal, round, and reactive to light. Left conjunctiva is injected.  Slit lamp exam:      The left eye shows no corneal abrasion, no corneal ulcer, no foreign body, no hyphema and no fluorescein uptake.  Conjunctiva irritation left.   Neck: Neck supple.  Cardiovascular: Normal rate.   Pulmonary/Chest: Effort normal.  Musculoskeletal: Normal range of motion.  Neurological: She is alert and oriented to person, place, and time. No cranial nerve deficit.  Skin: Skin is warm and dry.  Psychiatric: She has a normal mood and  affect. Her behavior is normal.  Nursing note and vitals reviewed.   ED Course  Procedures  Visual acuity, tetracaine Opth drops to left eye, stained, irrigated,  Dr. Estell HarpinZammit in to examine the patient.   MDM  34 y.o. female with splash of grease to corner of the left eye while at work. Stable for d/c to follow up with opthalmology. Tetanus updated, erythromycin opth. Ointment applied here and patient to continue for the next 5 days. She will use artificial tears as needed.  Pain management.  Final diagnoses:  Eye  pain, left       [facility] Jazleen Robeck, NP 12/24/14 1754  Bethann BerkshireJoseph Zammit, MD 12/24/14 2330

## 2014-12-23 NOTE — ED Notes (Signed)
Per patient vision is starting "to clear a little." Patient states "It's still a little blurry but I can tell it's coming back." EDP in room assessing patient.

## 2014-12-23 NOTE — ED Notes (Signed)
Pt states grease splattered in her left eye from a deep fryer. Work related injury. States she was told to go to [facility] but when she got there they were closed.

## 2014-12-23 NOTE — Discharge Instructions (Signed)
Your exam today shows that you have irritation to the left eye after the grease exposure. We do not see a blister or corneal abrasion at this time. You should follow up with Dr. Lita MainsHaines (eye doctor) for further evaluation. Use the antibiotic eye ointment every 8 hours for the next 5 days. Use artificial tears to keep the eye well lubricated. Apply cool compresses to the area and take Advil in addition to the narcotic. Do not drive while taking the narcotic. Follow up with Dr. Lita MainsHaines.

## 2014-12-23 NOTE — ED Notes (Signed)
[facility] on [address], [place], [place] [address] called at [phone] to inquire about drug screening for workers comp. Per manager, [employer] Covington, drug screen is not required.

## 2016-03-28 IMAGING — CT CT HEAD W/O CM
1 series · 16 of 30 positions shown, 20 images · non-contrast
Comparison: No prior.

CLINICAL DATA: Two syncopal episode last night. Patient hit back of
head on floor. Dizziness. Initial evaluation.

EXAM:
CT HEAD WITHOUT CONTRAST
TECHNIQUE: Contiguous axial images were obtained from the base of the skull
through the vertex without intravenous contrast.

[Series 2: headtrauma 4.8 h37s · axial · 0.40mm/px · z∈[+1015,+1145]mm · 16 of 30 slices shown, 20 images]
[im 2/30  brain]
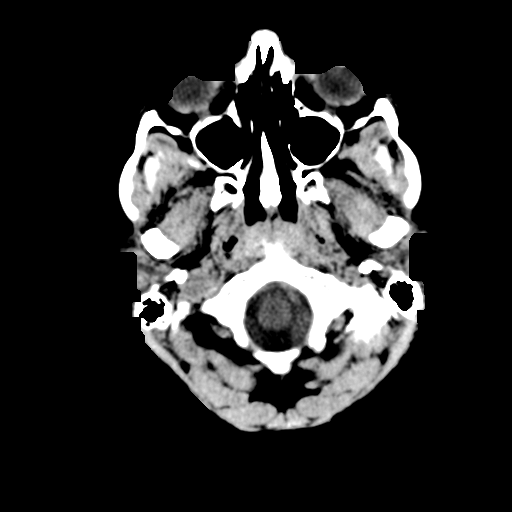
[im 2/30  bone]
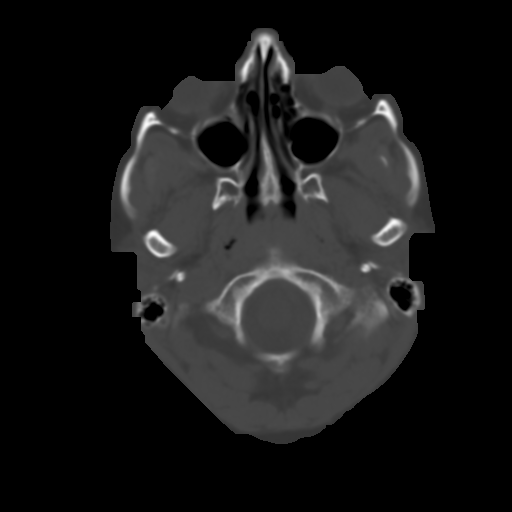
[im 4/30  brain]
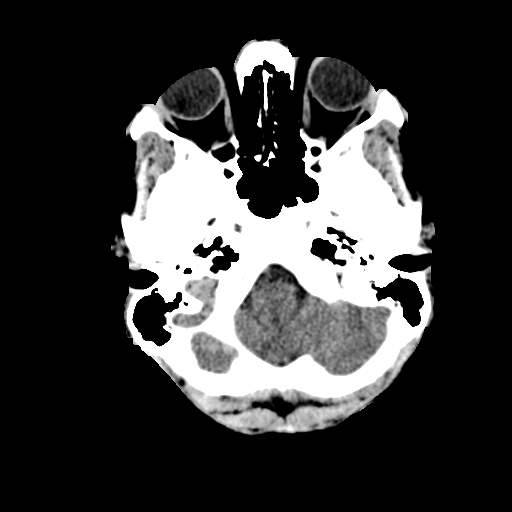
[im 6/30  brain]
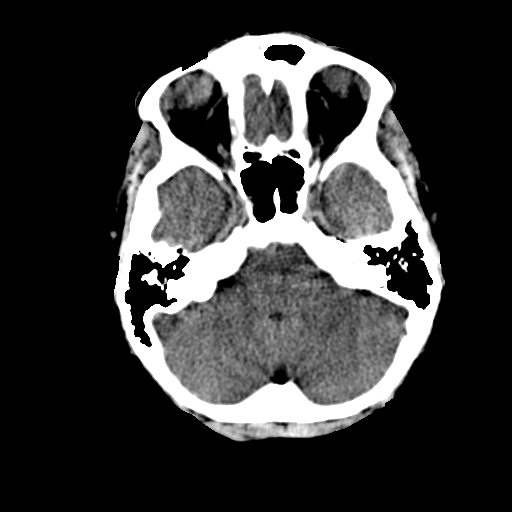
[im 8/30  brain]
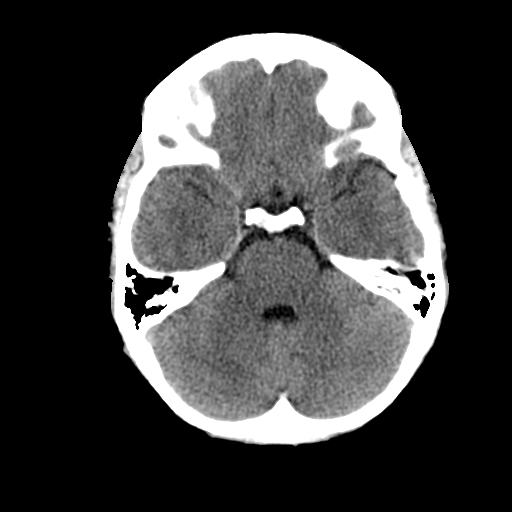
[im 9/30  brain]
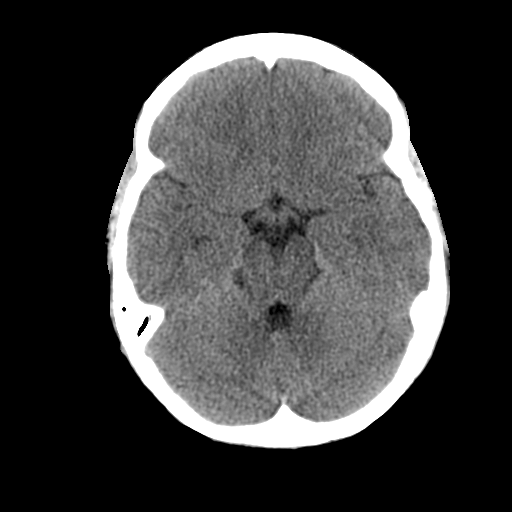
[im 9/30  bone]
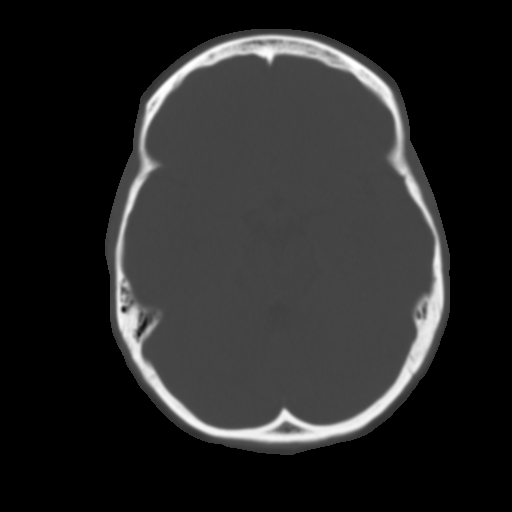
[im 11/30  brain]
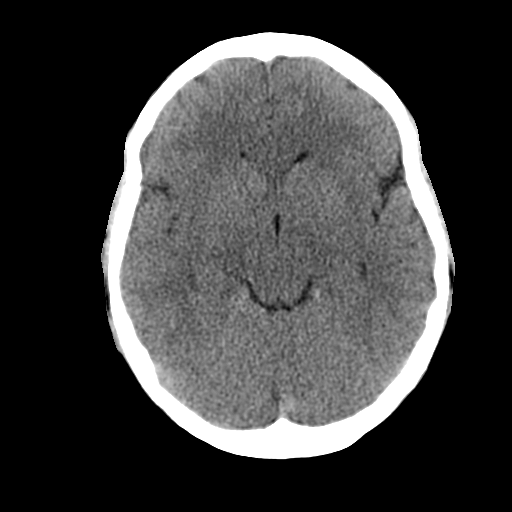
[im 13/30  brain]
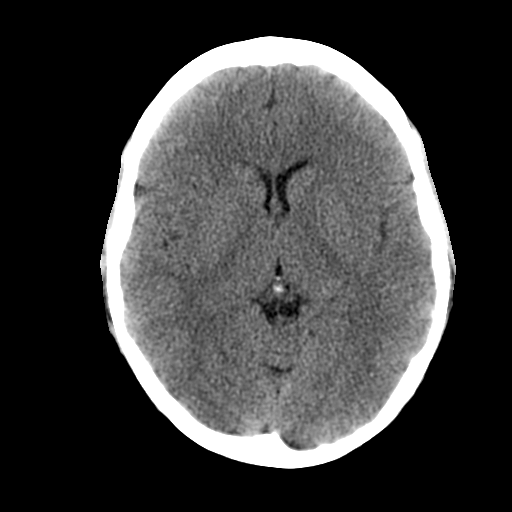
[im 15/30  brain]
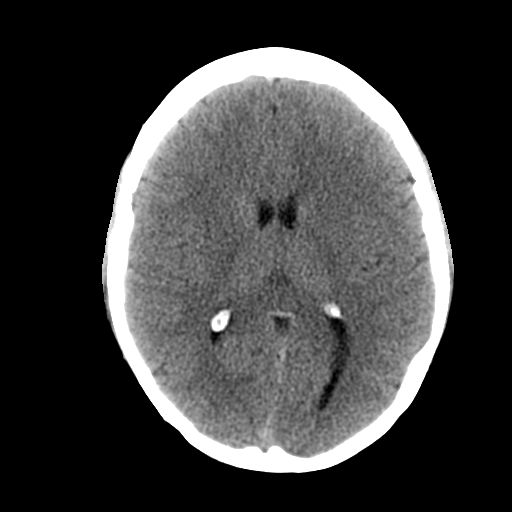
[im 16/30  brain]
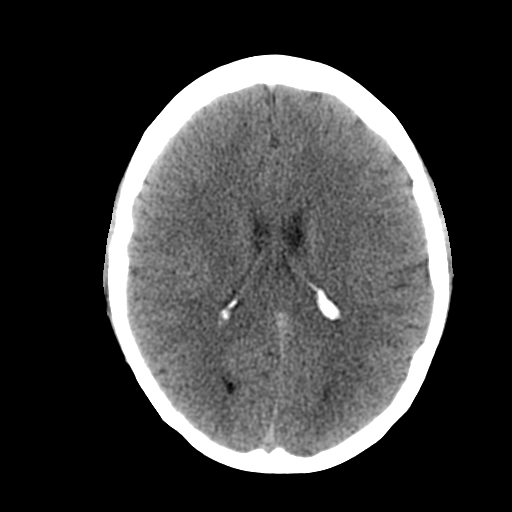
[im 16/30  bone]
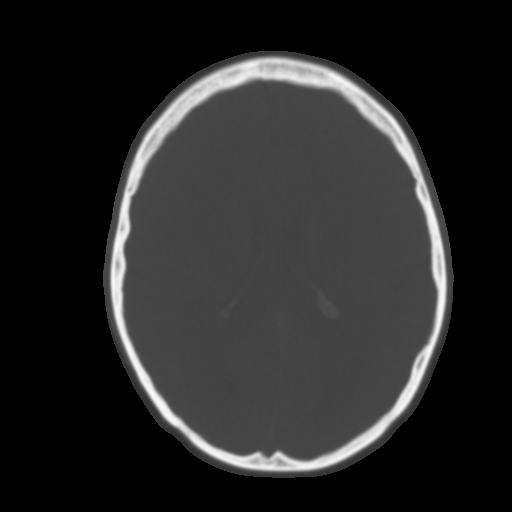
[im 18/30  brain]
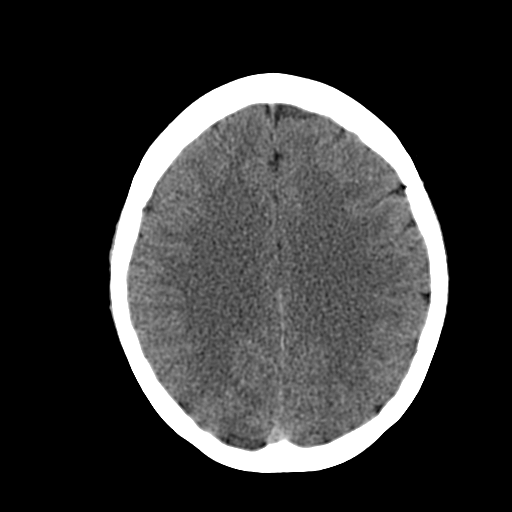
[im 20/30  brain]
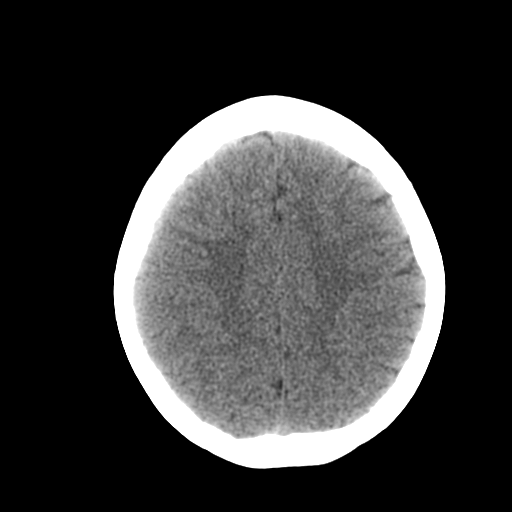
[im 22/30  brain]
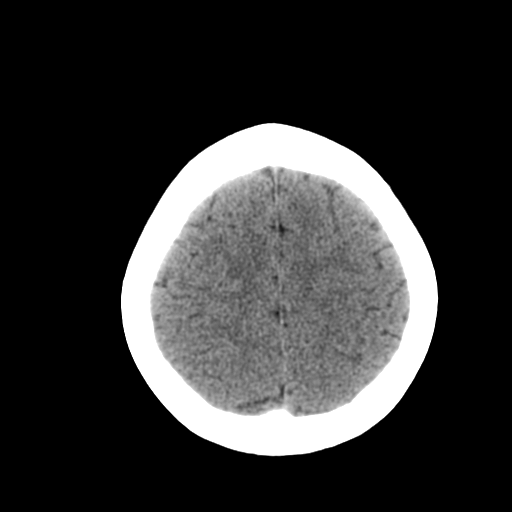
[im 23/30  brain]
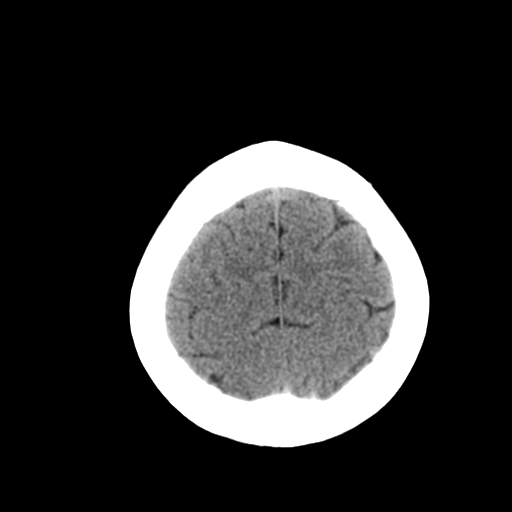
[im 23/30  bone]
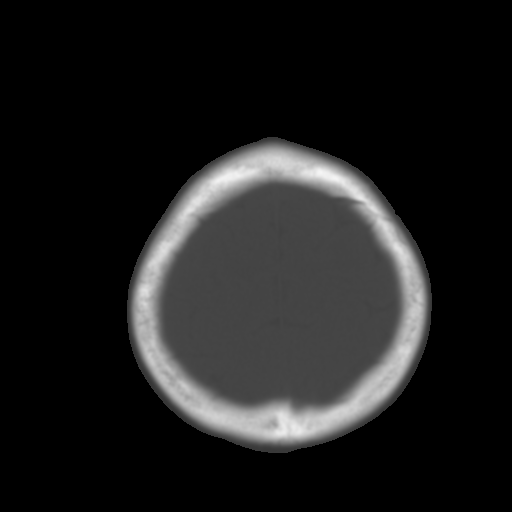
[im 25/30  brain]
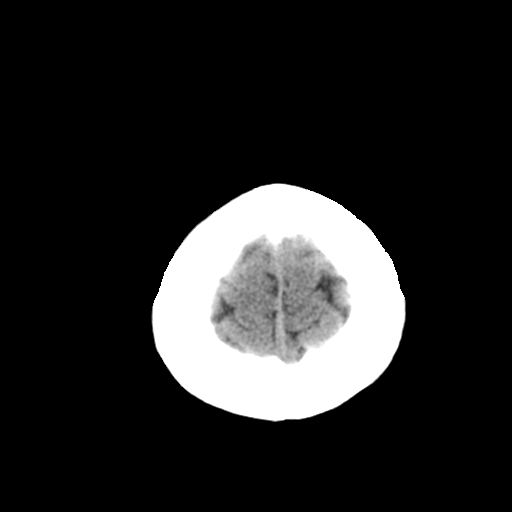
[im 27/30  brain]
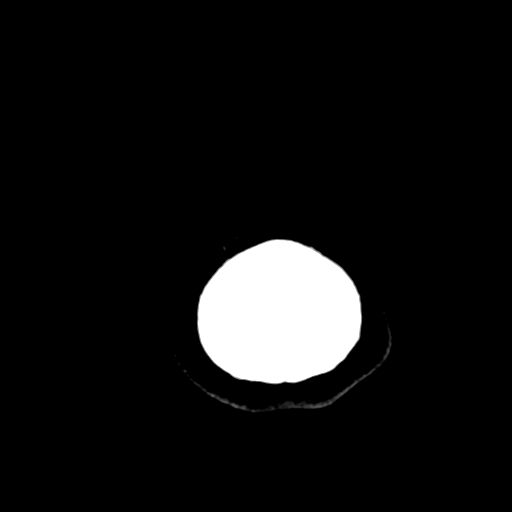
[im 29/30  brain]
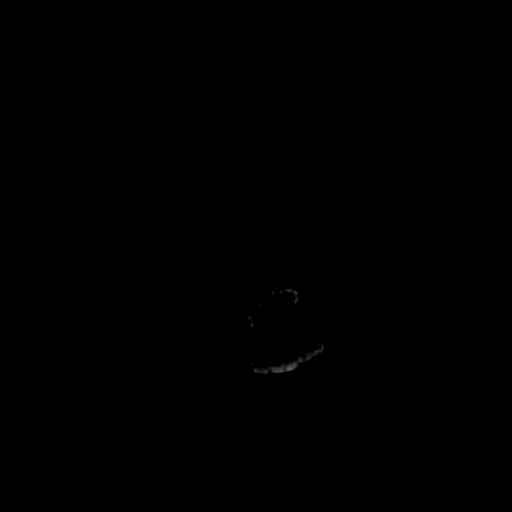

[16 of 30 positions shown; findings below may reference images not displayed]

FINDINGS: No intra-axial or extra-axial pathologic shoulder blood collection
identified. No mass. No mass. No hydrocephalus visualized orbits are
unremarkable. Visualized paranasal sinuses and mastoids are clear.
No acute bony abnormality.
IMPRESSION: No acute intracranial abnormality.

## 2017-08-02 ENCOUNTER — Emergency Department (HOSPITAL_COMMUNITY): Payer: Medicaid Other

## 2017-08-02 ENCOUNTER — Encounter (HOSPITAL_COMMUNITY): Payer: Self-pay

## 2017-08-02 ENCOUNTER — Other Ambulatory Visit: Payer: Self-pay

## 2017-08-02 ENCOUNTER — Emergency Department (HOSPITAL_COMMUNITY)
Admission: EM | Admit: 2017-08-02 | Discharge: 2017-08-02 | Disposition: A | Discharge status: Home / Self Care | Payer: Medicaid Other | Attending: Emergency Medicine | Admitting: Emergency Medicine

## 2017-08-02 DIAGNOSIS — R079 Chest pain, unspecified: Secondary | ICD-10-CM

## 2017-08-02 DIAGNOSIS — F17210 Nicotine dependence, cigarettes, uncomplicated: Secondary | ICD-10-CM | POA: Diagnosis not present

## 2017-08-02 HISTORY — DX: Cardiac murmur, unspecified: R01.1

## 2017-08-02 LAB — BASIC METABOLIC PANEL
Anion gap: 10 (ref 5–15)
BUN: 12 mg/dL (ref 6–20)
CALCIUM: 9.3 mg/dL (ref 8.9–10.3)
CO2: 26 mmol/L (ref 22–32)
Chloride: 101 mmol/L (ref 101–111)
Creatinine, Ser: 0.72 mg/dL (ref 0.44–1.00)
GFR calc Af Amer: >60 mL/min (ref >60)
Glucose, Bld: 95 mg/dL (ref 65–99)
POTASSIUM: 3.6 mmol/L (ref 3.5–5.1)
SODIUM: 137 mmol/L (ref 135–145)

## 2017-08-02 LAB — CBC
HCT: 45.1 % (ref 36.0–46.0)
Hemoglobin: 15 g/dL (ref 12.0–15.0)
MCH: 31.2 pg (ref 26.0–34.0)
MCHC: 33.3 g/dL (ref 30.0–36.0)
MCV: 93.8 fL (ref 78.0–100.0)
Platelets: 255 10*3/uL (ref 150–400)
RBC: 4.81 MIL/uL (ref 3.87–5.11)
RDW: 12.2 % (ref 11.5–15.5)
WBC: 8.8 10*3/uL (ref 4.0–10.5)

## 2017-08-02 LAB — TROPONIN I

## 2017-08-02 MED ORDER — PREDNISONE 20 MG PO TABS: 20.0000 mg | ORAL_TABLET | Freq: Every day | ORAL | 0 refills | Status: AC

## 2017-08-02 NOTE — ED Provider Notes (Signed)
[facility] EMERGENCY DEPARTMENT Provider Note   CSN: [id number] Arrival date & time: 08/02/17  1153     History   Chief Complaint Chief Complaint  Patient presents with  . Chest Pain    HPI Karolee StampsKeisha Konen is a 36 y.o. female.  HPI Patient presents with chest pain.  Is in her left upper chest.  Worse with movement and exertion's.  States worse with deep breaths also appears had for the last 3 weeks.  Began while she was at work.  States she has to lift heavy things at work.  No real difficulty breathing.  No hemoptysis.  No cough.  No swelling of legs.  She does smoke.  No family history of early cardiac disease.  Pain is dull.  No rash. Past Medical History:  Diagnosis Date  . Heart murmur     There are no active problems to display for this patient.   History reviewed. No pertinent surgical history.  OB History    No data available       Home Medications    Prior to Admission medications   Medication Sig Start Date End Date Taking? Authorizing Provider  predniSONE (DELTASONE) 20 MG tablet Take 1 tablet (20 mg total) by mouth daily. 08/02/17   Benjiman CorePickering, Skylynn Burkley, MD    Family History No family history on file.  Social History Social History   Tobacco Use  . Smoking status: Current Every Day Smoker    Packs/day: 1.00    Types: Cigarettes  . Smokeless tobacco: Never Used  Substance Use Topics  . Alcohol use: No    Comment: occ  . Drug use: No     Allergies   Patient has no known allergies.   Review of Systems Review of Systems  Constitutional: Negative for appetite change.  HENT: Negative for congestion.   Respiratory: Negative for shortness of breath.   Cardiovascular: Positive for chest pain.  Gastrointestinal: Negative for abdominal pain.  Genitourinary: Negative for flank pain.  Musculoskeletal: Negative for arthralgias.  Skin: Negative for rash.  Neurological: Negative for speech difficulty and light-headedness.  Hematological: Negative  for adenopathy.  Psychiatric/Behavioral: Negative for behavioral problems.     Physical Exam Updated Vital Signs BP 115/68   Pulse 82   Temp 98.6 F (37 C) (Oral)   Resp 17   Ht 5\' 7"  (1.702 m)   Wt 65.8 kg (145 lb)   LMP 07/19/2017   SpO2 100%   BMI 22.71 kg/m   Physical Exam  Constitutional: She appears well-developed.  HENT:  Head: Atraumatic.  Eyes: Pupils are equal, round, and reactive to light.  Cardiovascular: Normal rate, regular rhythm and normal pulses.  Pulmonary/Chest:  Pain in left upper chest wall with active and passive movement of left upper extremity.  Musculoskeletal:       Right lower leg: She exhibits no edema.       Left lower leg: She exhibits no edema.  Neurological: She is alert.  Skin: Skin is warm. Capillary refill takes less than 2 seconds.  Psychiatric: She has a normal mood and affect.     ED Treatments / Results  Labs (all labs ordered are listed, but only abnormal results are displayed) Labs Reviewed  BASIC METABOLIC PANEL  CBC  TROPONIN I    EKG  EKG Interpretation  Date/Time:  Sunday August 02 2017 12:10:05 EST Ventricular Rate:  88 PR Interval:  130 QRS Duration: 76 QT Interval:  336 QTC Calculation: 406 R Axis:  76 Text Interpretation:  Normal sinus rhythm Low voltage QRS Borderline ECG Confirmed by Benjiman CorePickering, Berdene Askari [phone]([id number]) on 08/02/2017 3:24:59 PM       Radiology Dg Chest 2 View  Result Date: 08/02/2017 CLINICAL DATA:  Left-sided chest pain for the past 3 weeks. EXAM: CHEST  2 VIEW COMPARISON:  Chest x-ray dated May 13, 2014. FINDINGS: The heart size and mediastinal contours are within normal limits. Mildly coarsened interstitial markings are likely smoking-related. Both lungs are otherwise clear. The visualized skeletal structures are unremarkable. IMPRESSION: No active cardiopulmonary disease. Electronically Signed   By: Obie DredgeWilliam T Derry M.D.   On: 08/02/2017 12:47    Procedures Procedures (including  critical care time)  Medications Ordered in ED Medications - No data to display   Initial Impression / Assessment and Plan / ED Course  I have reviewed the triage vital signs and the nursing notes.  Pertinent labs & imaging results that were available during my care of the patient were reviewed by me and considered in my medical decision making (see chart for details).     Patient with left upper chest pain.  Potentially could be musculoskeletal.  Worse with movements and somewhat worse with palpitations.  EKG x-ray and lab work reassuring.  Has had for 3 weeks.  Will give short course of prednisone to see if it will help.  Has Artie been taking Motrin.  Will discharge home.  Final Clinical Impressions(s) / ED Diagnoses   Final diagnoses:  Nonspecific chest pain    ED Discharge Orders        Ordered    predniSONE (DELTASONE) 20 MG tablet  Daily     08/02/17 1539       Benjiman CorePickering, Meribeth Vitug, MD 08/02/17 1557

## 2017-08-02 NOTE — ED Notes (Signed)
EKG given to Dr. Cook  

## 2017-08-02 NOTE — ED Triage Notes (Signed)
Patient reports of chest pain that radiates into left arm x3 weeks. 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 Also reports of cough. Has not taken anything for pain.

## 2018-03-19 ENCOUNTER — Other Ambulatory Visit (HOSPITAL_COMMUNITY): Payer: Self-pay | Admitting: Nurse Practitioner

## 2018-03-19 DIAGNOSIS — Z30431 Encounter for routine checking of intrauterine contraceptive device: Secondary | ICD-10-CM

## 2018-03-25 ENCOUNTER — Ambulatory Visit (HOSPITAL_COMMUNITY)
Admission: RE | Admit: 2018-03-25 | Discharge: 2018-03-25 | Disposition: A | Discharge status: Home / Self Care | Payer: Medicaid Other | Source: Ambulatory Visit | Attending: Nurse Practitioner | Admitting: Nurse Practitioner

## 2018-03-25 DIAGNOSIS — N83291 Other ovarian cyst, right side: Secondary | ICD-10-CM | POA: Diagnosis not present

## 2018-03-25 DIAGNOSIS — Z30431 Encounter for routine checking of intrauterine contraceptive device: Secondary | ICD-10-CM | POA: Insufficient documentation

## 2018-09-01 ENCOUNTER — Ambulatory Visit (INDEPENDENT_AMBULATORY_CARE_PROVIDER_SITE_OTHER): Payer: BLUE CROSS/BLUE SHIELD | Admitting: Orthopaedic Surgery

## 2018-09-01 ENCOUNTER — Encounter (INDEPENDENT_AMBULATORY_CARE_PROVIDER_SITE_OTHER): Payer: Self-pay | Admitting: Orthopaedic Surgery

## 2018-09-01 ENCOUNTER — Ambulatory Visit (INDEPENDENT_AMBULATORY_CARE_PROVIDER_SITE_OTHER): Payer: Self-pay

## 2018-09-01 VITALS — BP 112/68 | HR 87 | Ht 61.0 in | Wt 146.0 lb

## 2018-09-01 DIAGNOSIS — G8929 Other chronic pain: Secondary | ICD-10-CM

## 2018-09-01 DIAGNOSIS — M25512 Pain in left shoulder: Secondary | ICD-10-CM

## 2018-09-01 MED ORDER — LIDOCAINE HCL 2 % IJ SOLN
2.0000 mL | INTRAMUSCULAR | Status: AC | PRN
  Administered 2018-09-01: 2 mL

## 2018-09-01 MED ORDER — BUPIVACAINE HCL 0.5 % IJ SOLN
2.0000 mL | INTRAMUSCULAR | Status: AC | PRN
  Administered 2018-09-01: 2 mL via INTRA_ARTICULAR

## 2018-09-01 MED ORDER — METHYLPREDNISOLONE ACETATE 40 MG/ML IJ SUSP
80.0000 mg | INTRAMUSCULAR | Status: AC | PRN
  Administered 2018-09-01: 80 mg

## 2018-09-01 NOTE — Progress Notes (Signed)
Office Visit Note   Patient: Hannah Molina           Date of Birth: 03-Feb-1981           MRN: [id number] Visit Date: 09/01/2018              Requested by: Medicine, [facility] [address] [place], [place] [address] PCP: Medicine, [facility]   Assessment & Plan: Visit Diagnoses:  1. Chronic left shoulder pain     Plan: Impingement syndrome left shoulder.  I will inject the subacromial region with cortisone and monitor response.  Work note to be off today tomorrow and Friday.  Reevaluate in 3 to 4 weeks if no improvement.  Had significant relief of her pain after the injection.  Hold on diclofenac if possible  Follow-Up Instructions: No follow-ups on file.   Orders:  Orders Placed This Encounter  Procedures  . XR Shoulder Left   No orders of the defined types were placed in this encounter.     Procedures: Large Joint Inj: L subacromial bursa on 09/01/2018 3:25 PM Indications: pain and diagnostic evaluation Details: 25 G 1.5 in needle, anterolateral approach  Arthrogram: No  Medications: 2 mL lidocaine 2 %; 2 mL bupivacaine 0.5 %; 80 mg methylPREDNISolone acetate 40 MG/ML Consent was given by the patient. Immediately prior to procedure a time out was called to verify the correct patient, procedure, equipment, support staff and site/side marked as required. Patient was prepped and draped in the usual sterile fashion.       Clinical Data: No additional findings.   Subjective: No chief complaint on file. 38 year old female visits the office for evaluation of chronic left shoulder pain.  She first noted insidious onset approximately a year ago.  She has been followed by Hannah Cornwall, NP with diclofenac which significantly alleviates her pain.  She has not had any numbness or tingling.  She is not having any neck pain.  She has had some difficulty raising her arm over her head but no loss of motion.  She does work at [employer] and lifting heavy loads on a daily basis.  No  specific injury or trauma.  Office notes from her primary care physician accompany her and are reviewed  HPI  Review of Systems   Objective: Vital Signs: There were no vitals taken for this visit.  Physical Exam Constitutional:      Appearance: She is well-developed.  Eyes:     Pupils: Pupils are equal, round, and reactive to light.  Pulmonary:     Effort: Pulmonary effort is normal.  Skin:    General: Skin is warm and dry.  Neurological:     Mental Status: She is alert and oriented to person, place, and time.  Psychiatric:        Behavior: Behavior normal.     Ortho Exam wake alert and oriented x3.  Comfortable sitting.  Able to raise her left arm overhead but with some discomfort.  Full internal and external rotation.  No crepitation.  Some tenderness in the area of the chromic clavicular joint and anterior subacromial region.  Positive impingement on the extreme of motion.  Minimally positive empty can testing.  Biceps intact.  Good grip and good release.  No pain with range of motion of cervical spine  Specialty Comments:  No specialty comments available.  Imaging: No results found.   PMFS History: There are no active problems to display for this patient.  Past Medical History:  Diagnosis Date  . Heart murmur     History reviewed. No pertinent family history.  History reviewed. No pertinent surgical history. Social History   Occupational History  . Not on file  Tobacco Use  . Smoking status: Current Every Day Smoker    Packs/day: 1.00    Types: Cigarettes  . Smokeless tobacco: Never Used  Substance and Sexual Activity  . Alcohol use: No    Comment: occ  . Drug use: No  . Sexual activity: Yes    Birth control/protection: I.U.D.     Valeria Batman, MD   Note - This record has been created using [employer].  Chart creation errors have been sought, but may not always  have been located. Such creation errors do not reflect on  the standard of  medical care.

## 2018-10-04 ENCOUNTER — Other Ambulatory Visit (INDEPENDENT_AMBULATORY_CARE_PROVIDER_SITE_OTHER): Payer: Self-pay | Admitting: *Deleted

## 2018-10-05 ENCOUNTER — Telehealth (INDEPENDENT_AMBULATORY_CARE_PROVIDER_SITE_OTHER): Payer: Self-pay | Admitting: Orthopaedic Surgery

## 2018-10-05 ENCOUNTER — Other Ambulatory Visit (INDEPENDENT_AMBULATORY_CARE_PROVIDER_SITE_OTHER): Payer: Self-pay | Admitting: Orthopaedic Surgery

## 2018-10-05 DIAGNOSIS — G8929 Other chronic pain: Secondary | ICD-10-CM

## 2018-10-05 DIAGNOSIS — M25512 Pain in left shoulder: Principal | ICD-10-CM

## 2018-10-05 NOTE — Telephone Encounter (Signed)
Patient is requesting Diclofenac tablets. She has been getting them from her PCP, but now that she is seeing Dr.Whitfield her PCP wants her to get them from Dr.Whitfield. Please advise.

## 2018-10-05 NOTE — Telephone Encounter (Signed)
Not a med that I use frequently-needs return to office

## 2018-10-06 NOTE — Telephone Encounter (Signed)
Spoke with [employer] office. Claris Gower is going to call patient and inform her about information below. She is already scheduled to come in for MRI results in a couple weeks.

## 2018-10-08 ENCOUNTER — Ambulatory Visit (HOSPITAL_COMMUNITY): Payer: Self-pay

## 2018-10-20 ENCOUNTER — Other Ambulatory Visit: Payer: Self-pay

## 2018-10-20 ENCOUNTER — Ambulatory Visit (INDEPENDENT_AMBULATORY_CARE_PROVIDER_SITE_OTHER): Payer: BLUE CROSS/BLUE SHIELD | Admitting: Orthopaedic Surgery

## 2018-10-20 ENCOUNTER — Encounter (INDEPENDENT_AMBULATORY_CARE_PROVIDER_SITE_OTHER): Payer: Self-pay | Admitting: Orthopaedic Surgery

## 2018-10-20 DIAGNOSIS — M25512 Pain in left shoulder: Secondary | ICD-10-CM

## 2018-10-20 DIAGNOSIS — G8929 Other chronic pain: Secondary | ICD-10-CM

## 2018-10-20 MED ORDER — DICLOFENAC SODIUM 1 % TD GEL: 4.0000 g | Freq: Four times a day (QID) | TRANSDERMAL | 4 refills | Status: AC

## 2018-10-20 NOTE — Addendum Note (Signed)
Addended by: Wendi Maya on: 10/20/2018 03:56 PM   Modules accepted: Orders

## 2018-10-20 NOTE — Progress Notes (Signed)
Office Visit Note   Patient: Hannah Molina           Date of Birth: [birth year]           MRN: [id number] Visit Date: 10/20/2018              Requested by: Medicine, [facility] [address] [place], [place] [address] PCP: Medicine, [facility]   Assessment & Plan: Visit Diagnoses:  1. 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  There was no evidence of rotator cuff tear or tendinitis.  No glenohumeral joint arthritis.  Symptoms are consistent with impingement.  I think Voltaren gel and exercises would be helpful.  We will set up therapy at [facility] and plan to see her back sometime in the next 4 to 6 weeks for another injection if she is still symptomatic Follow-Up Instructions: Return if symptoms worsen or fail to improve.   Orders:  No orders of the defined types were placed in this encounter.  No orders of the defined types were placed in this encounter.     Procedures: No procedures performed   Clinical Data: No additional findings.   Subjective: No chief complaint on file. Left shoulder pain as previously outlined over the last several months.  A subacromial cortisone injection the left shoulder made a big difference at the end of January.  Presently working.  Still having some discomfort with overhead activity.  MRI scan was performed revealing some mild degenerative changes at the chromic clavicular joint but no other abnormality.  HPI  Review of Systems   Objective: Vital Signs: There were no vitals taken for this visit.  Physical Exam Constitutional:      Appearance: She is well-developed.  Eyes:     Pupils: Pupils are equal, round, and reactive to light.  Pulmonary:     Effort: Pulmonary effort is normal.  Skin:    General: Skin is warm and dry.  Neurological:     Mental Status: She is alert and oriented to person, place, and time.  Psychiatric:        Behavior: Behavior  normal.     Ortho Exam able to place left arm fully over the head.  Some discomfort without motion.  Minimally positive impingement testing and minimally positive empty can testing.  Skin intact.  Some tenderness in the area of the [facility] joint and anterior subacromial region.  Biceps intact  Specialty Comments:  No specialty comments available.  Imaging: No results found.   PMFS History: There are no active problems to display for this patient.  Past Medical History:  Diagnosis Date  . Heart murmur     History reviewed. No pertinent family history.  History reviewed. No pertinent surgical history. Social History   Occupational History  . Not on file  Tobacco Use  . Smoking status: Current Every Day Smoker    Packs/day: 1.00    Types: Cigarettes  . Smokeless tobacco: Never Used  Substance and Sexual Activity  . Alcohol use: No    Comment: occ  . Drug use: No  . Sexual activity: Yes    Birth control/protection: I.U.D.     Valeria Batman, MD   Note - This record has been created using [employer].  Chart creation errors have been sought, but may not always  have been located. Such creation errors do not reflect on  the standard of medical  care.

## 2018-10-20 NOTE — Addendum Note (Signed)
Addended by: Wendi Maya on: 10/20/2018 01:28 PM   Modules accepted: Orders

## 2018-10-21 ENCOUNTER — Telehealth: Payer: Self-pay | Admitting: Rheumatology

## 2018-10-21 ENCOUNTER — Other Ambulatory Visit (INDEPENDENT_AMBULATORY_CARE_PROVIDER_SITE_OTHER): Payer: Self-pay | Admitting: Orthopedic Surgery

## 2018-10-21 MED ORDER — DICLOFENAC SODIUM 75 MG PO TBEC: DELAYED_RELEASE_TABLET | ORAL | 0 refills | Status: DC

## 2018-10-21 NOTE — Telephone Encounter (Signed)
ESCRIBED IT TO RAJHHID

## 2018-10-21 NOTE — Telephone Encounter (Signed)
I called patient 

## 2018-10-21 NOTE — Telephone Encounter (Signed)
Patient called stating the pharmacist at [facility] told her [employer]BS does not cover Diclofenac Sodium 1% Gel and her cost would be $189.00.  Patient is requesting Diclofenac Sodium 75 mg tablets which she has taken in the past and is covered by insurance.

## 2018-10-21 NOTE — Telephone Encounter (Signed)
Please advise 

## 2018-12-07 ENCOUNTER — Other Ambulatory Visit (INDEPENDENT_AMBULATORY_CARE_PROVIDER_SITE_OTHER): Payer: Self-pay | Admitting: Orthopedic Surgery

## 2020-02-08 IMAGING — US US PELVIS COMPLETE TRANSABD/TRANSVAG
1 series · 13 of 25 positions shown · non-contrast
Comparison: None

CLINICAL DATA: Initial evaluation for IUD checkup.



[Series 1: us pelvis complete transabd/transvag · 96 acquisitions, 13 frames shown]
[im 1/96]
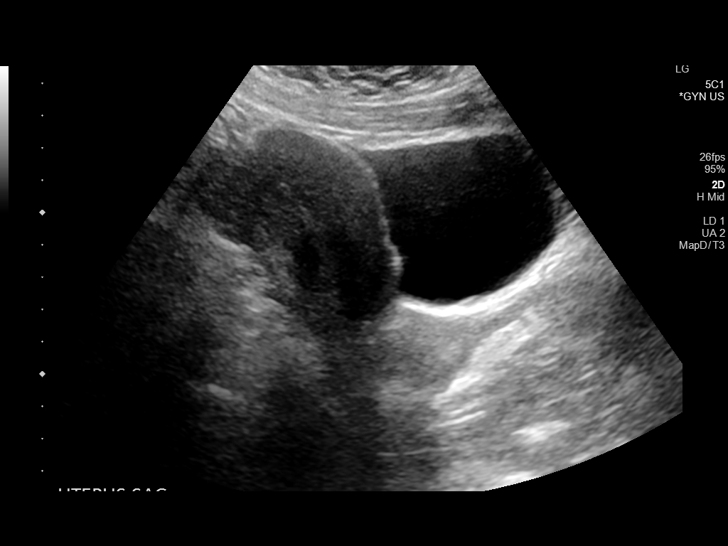
[im 8/96]
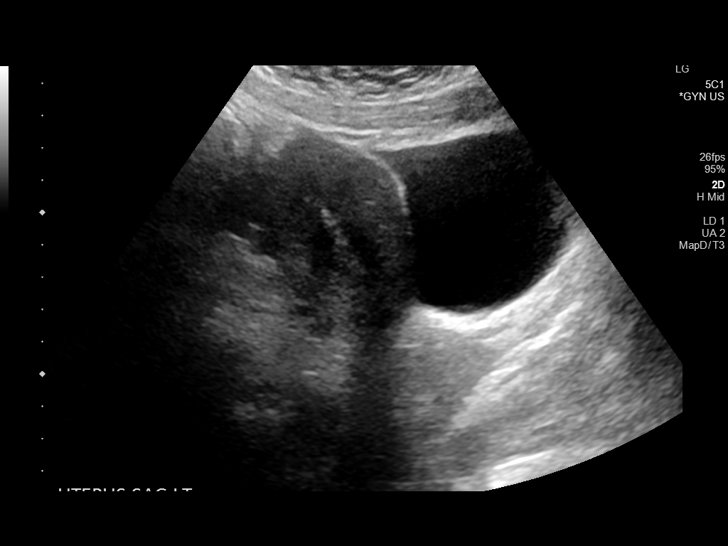
[im 16/96]
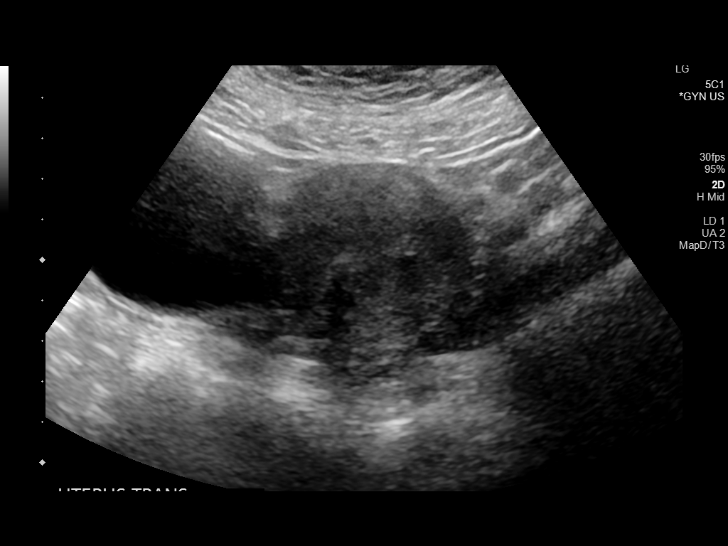
[im 24/96]
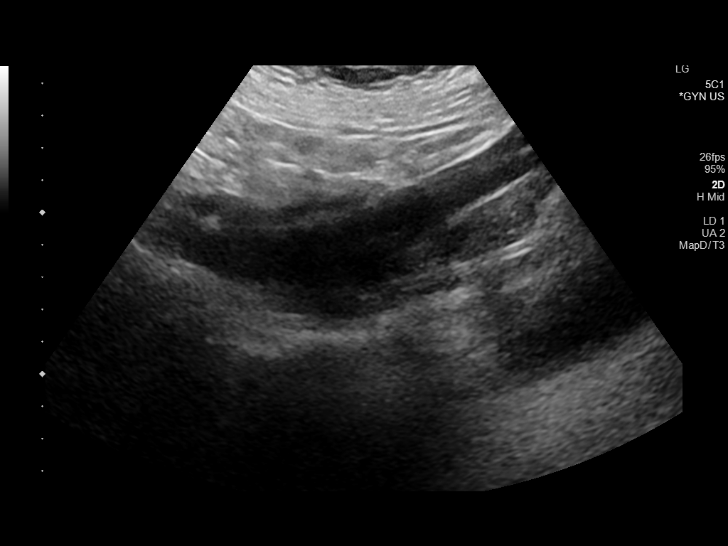
[im 32/96]
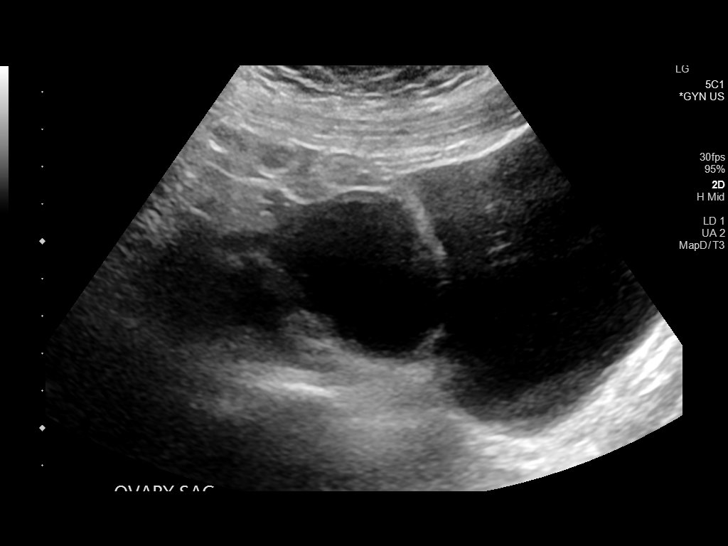
[im 40/96]
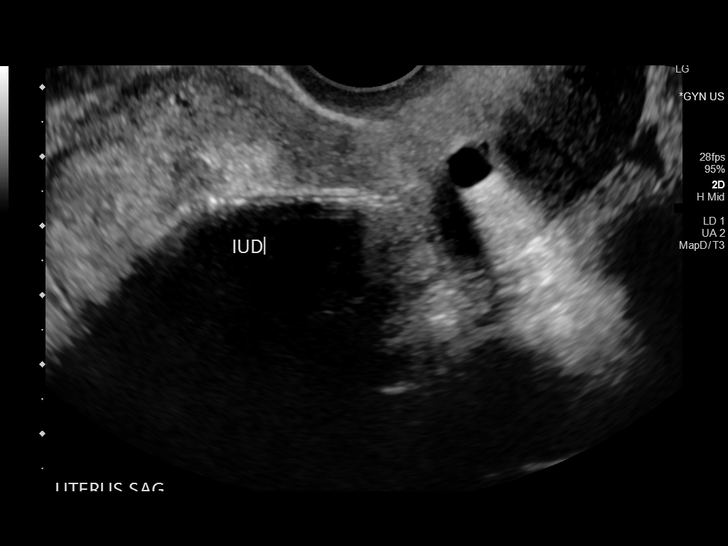
[im 48/96]
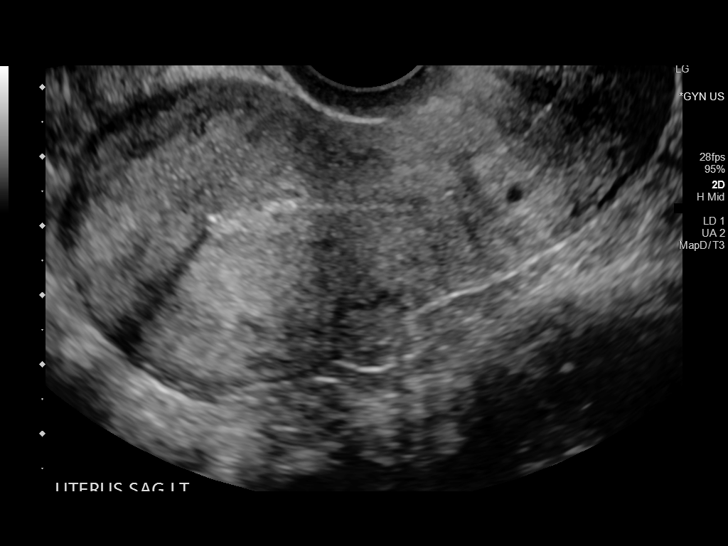
[im 56/96]
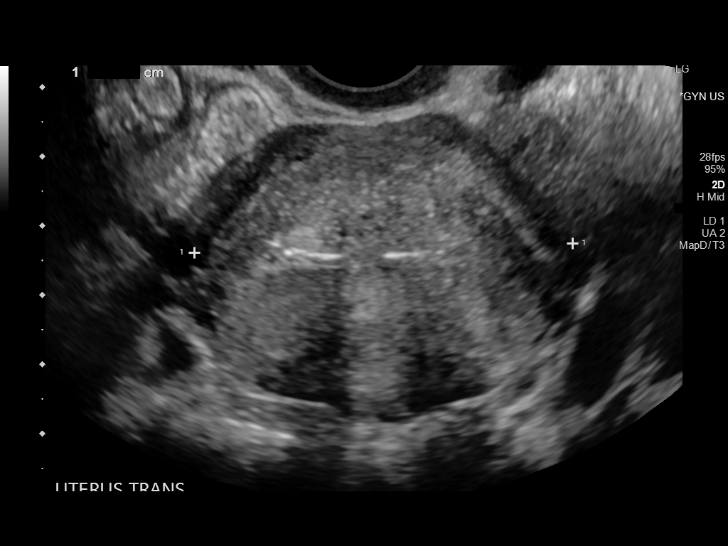
[im 64/96]
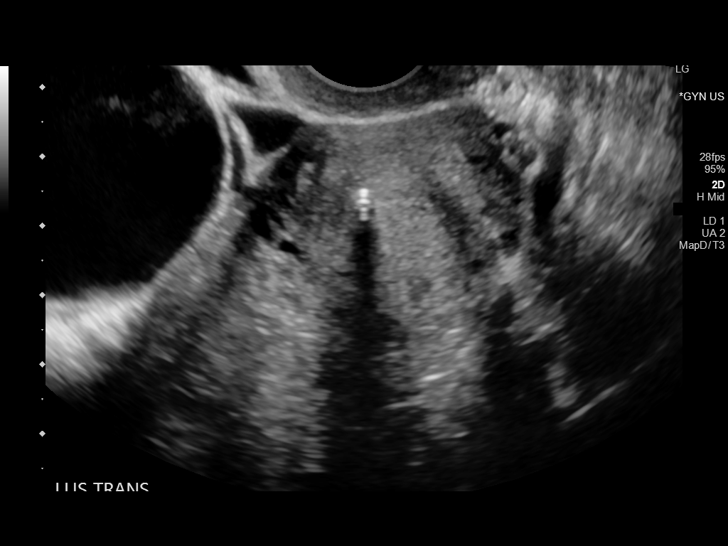
[im 72/96]
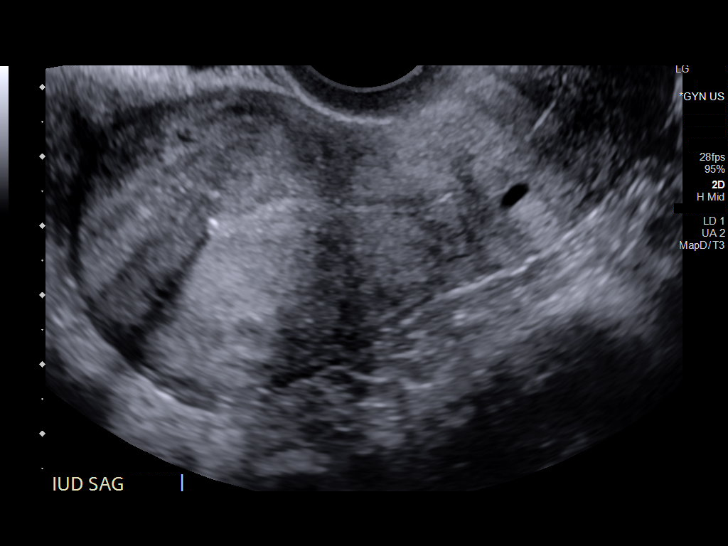
[im 80/96]
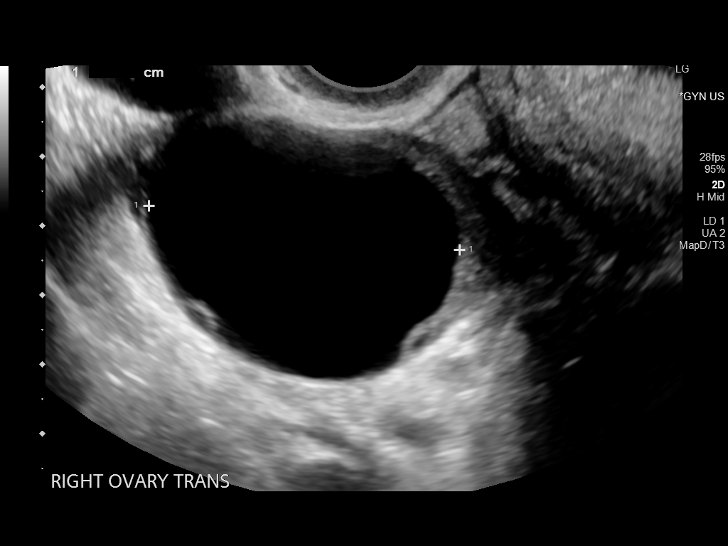
[im 88/96]
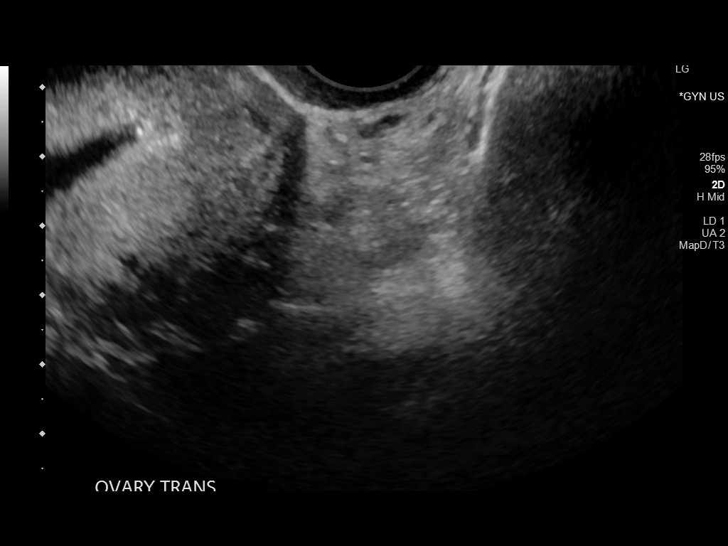
[im 96/96]
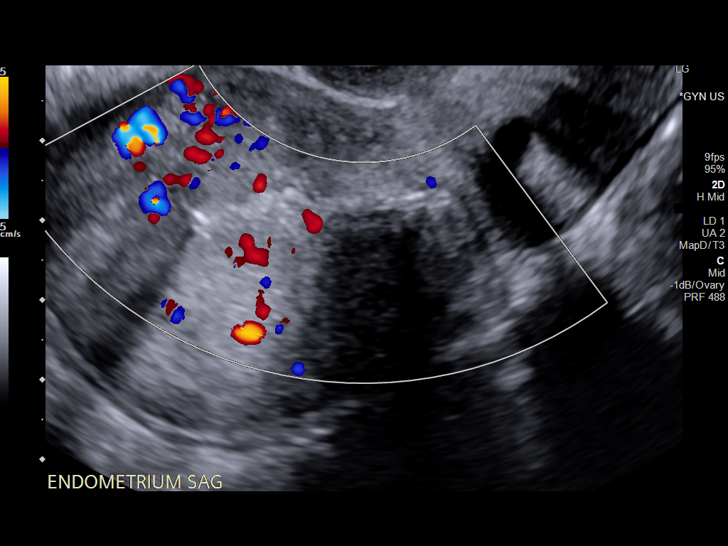

[13 of 25 positions shown; findings below may reference images not displayed]

FINDINGS: Uterus

Measurements: 9.0 x 4.4 x 5.5 cm. No fibroids or other mass
visualized.

Endometrium

Thickness: 4 mm. No focal abnormality visualized. IUD in appropriate
position within the endometrial cavity.

Right ovary

Measurements: 5.4 x 3.7 x 5.4 cm. 4.9 x 3.4 x 4.5 cm simple cyst.
Few small internal daughter cysts noted.

Left ovary

Not visualized due to overlying bowel gas.  No adnexal mass.

Other findings

No abnormal free fluid.
IMPRESSION: 1. IUD in appropriate position within the endometrial cavity.
2. 4.9 cm simple right ovarian cyst. This is almost certainly
benign, and no specific imaging follow up is recommended according
to the Society of Radiologists in VltrasoundNR3R Consensus
Conference Statement (Keyli Tiger et al. Management of Asymptomatic
Ovarian and Other Adnexal Cysts Imaged at US: Society of
Radiologists in Ultrasound Consensus Conference Statement 3010.
Radiology [DATE]): [id number].).

## 2023-06-29 ENCOUNTER — Other Ambulatory Visit (HOSPITAL_COMMUNITY): Payer: Self-pay | Admitting: Internal Medicine

## 2023-06-29 ENCOUNTER — Ambulatory Visit (HOSPITAL_COMMUNITY)
Admission: RE | Admit: 2023-06-29 | Discharge: 2023-06-29 | Disposition: A | Discharge status: Home / Self Care | Payer: BC Managed Care – PPO | Source: Ambulatory Visit | Attending: Internal Medicine | Admitting: Internal Medicine

## 2023-06-29 DIAGNOSIS — M79604 Pain in right leg: Secondary | ICD-10-CM | POA: Diagnosis present
